# Patient Record
Sex: Male | Born: 1980 | Race: White | Hispanic: No | Marital: Single | Smoking: Current every day smoker
Health system: Southern US, Community
[De-identification: ages and names within clinical notes are randomized; demographics above are authoritative.]

## PROBLEM LIST (undated history)

## (undated) DIAGNOSIS — J45909 Unspecified asthma, uncomplicated: Secondary | ICD-10-CM

## (undated) HISTORY — DX: Unspecified asthma, uncomplicated: J45.909

---

## 2009-08-01 HISTORY — PX: CHOLECYSTECTOMY: SHX55

## 2010-06-25 ENCOUNTER — Emergency Department: Payer: Self-pay | Admitting: Emergency Medicine

## 2010-07-03 ENCOUNTER — Inpatient Hospital Stay: Payer: Self-pay | Admitting: Surgery

## 2010-07-07 LAB — PATHOLOGY REPORT

## 2010-12-27 IMAGING — US ABDOMEN ULTRASOUND
1 series · 17 of 25 positions shown · non-contrast
Comparison: none

REASON FOR EXAM: abd pain, epigastric and ruq
COMMENTS:

PROCEDURE:     US  - US ABDOMEN GENERAL SURVEY  - July 02, 2010 [DATE]
RESULT:     Abdominal ultrasound dated 07/02/2010.

[Series 1: abdomen ultrasound · 17 of 74 slices shown]
[im 1/74]
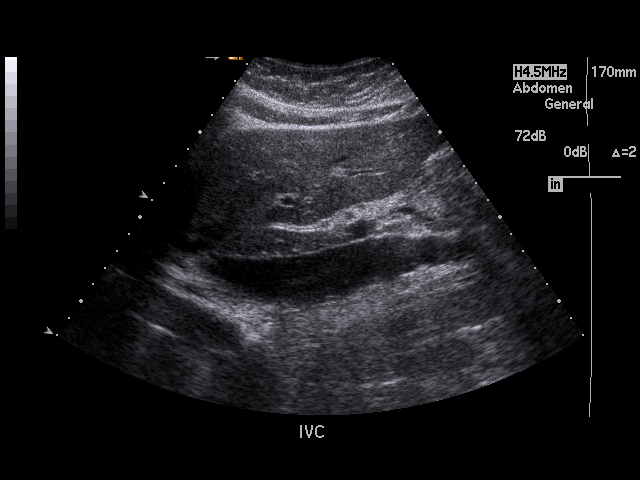
[im 7/74]
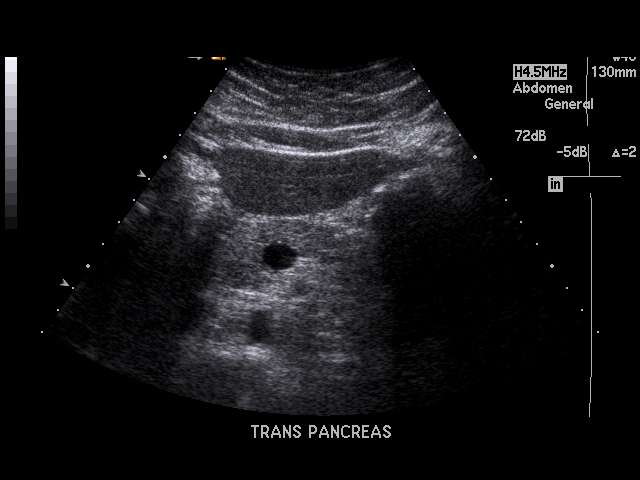
[im 10/74]
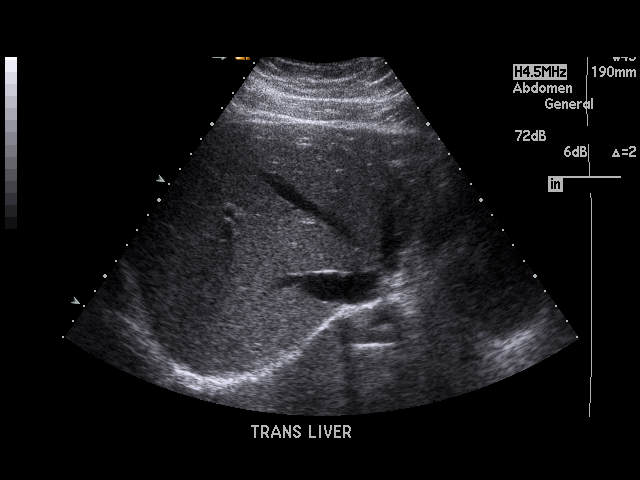
[im 16/74]
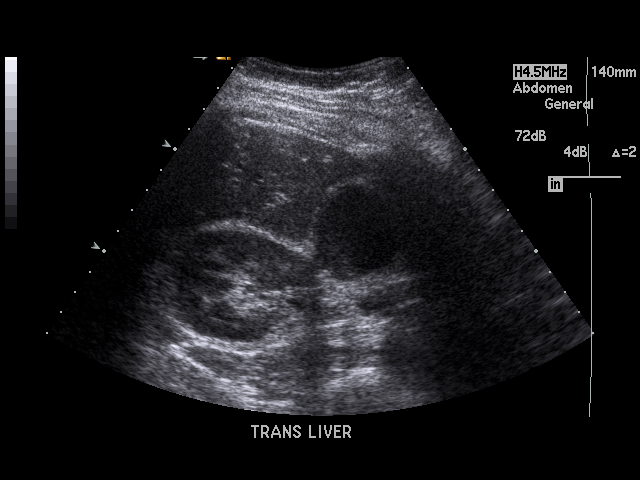
[im 19/74]
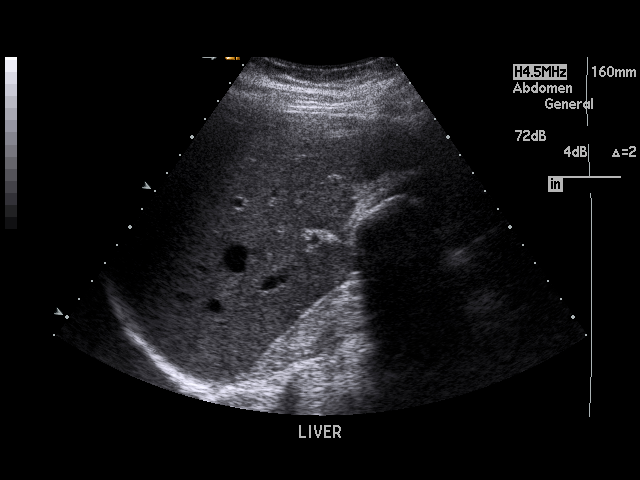
[im 25/74]
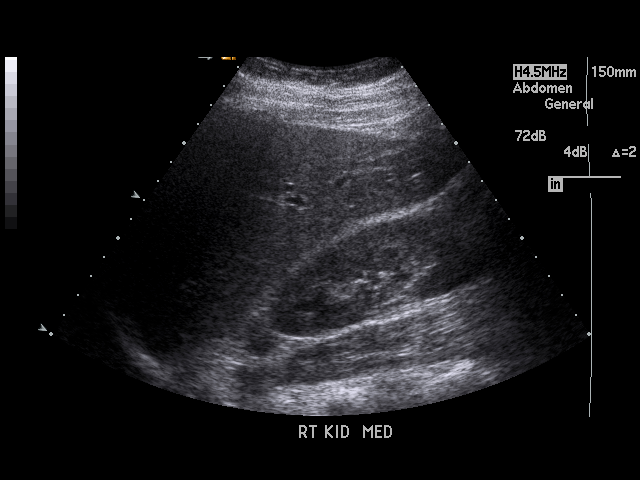
[im 28/74]
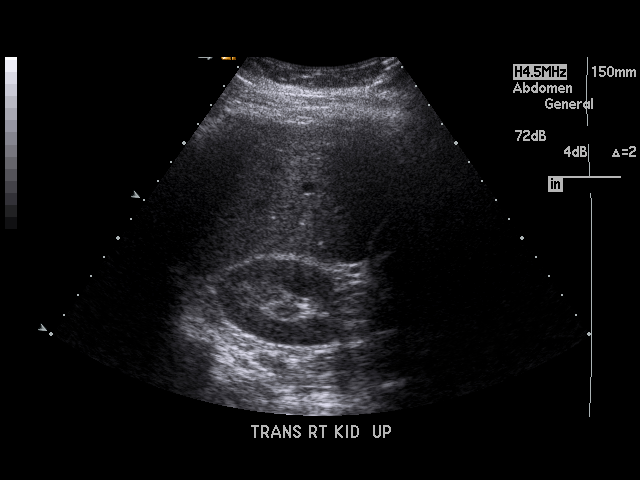
[im 34/74]
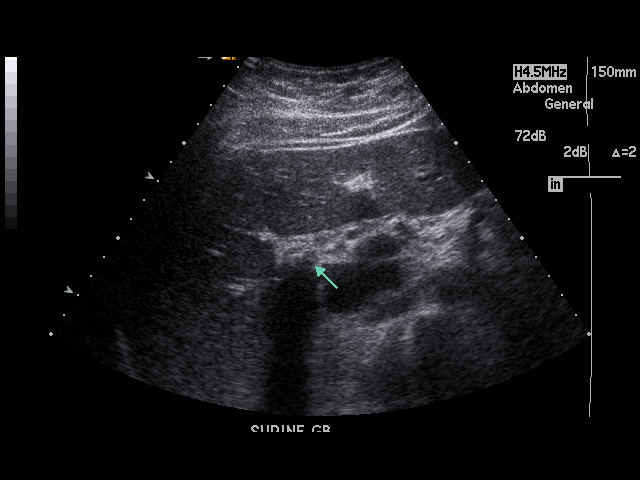
[im 37/74]
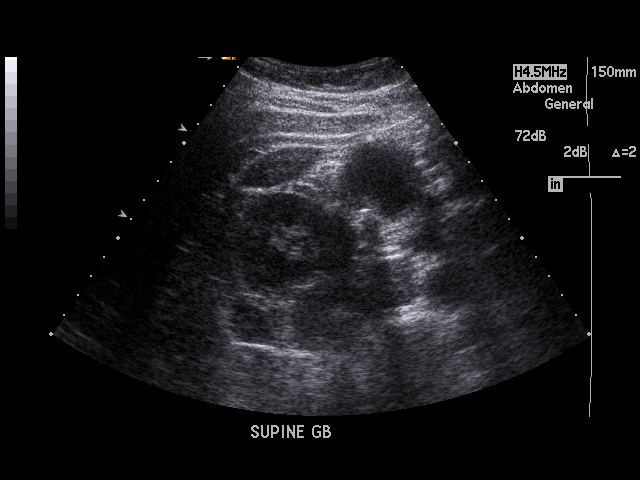
[im 40/74]
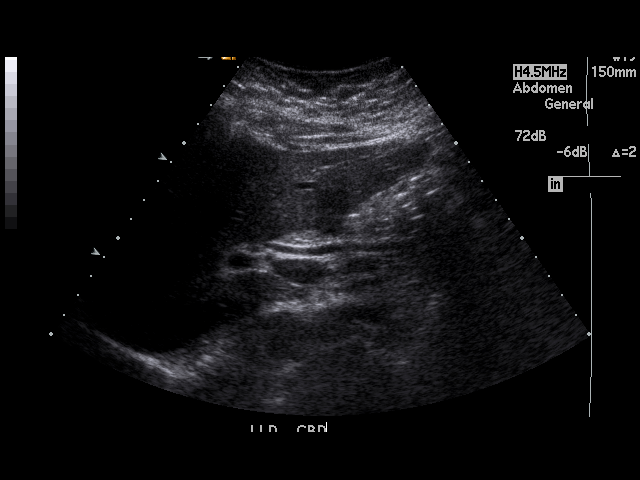
[im 46/74]
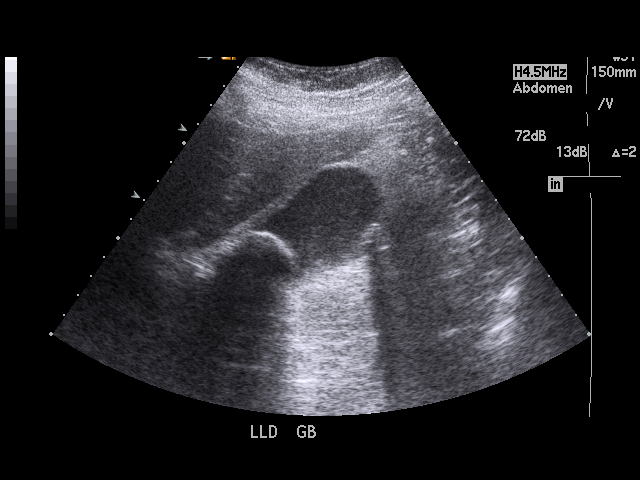
[im 49/74]
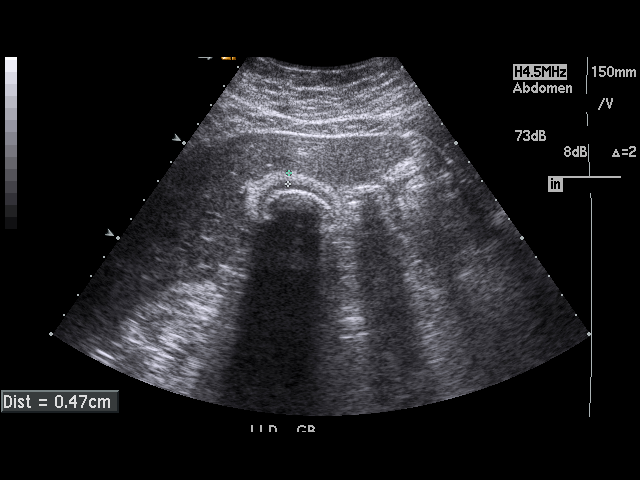
[im 55/74]
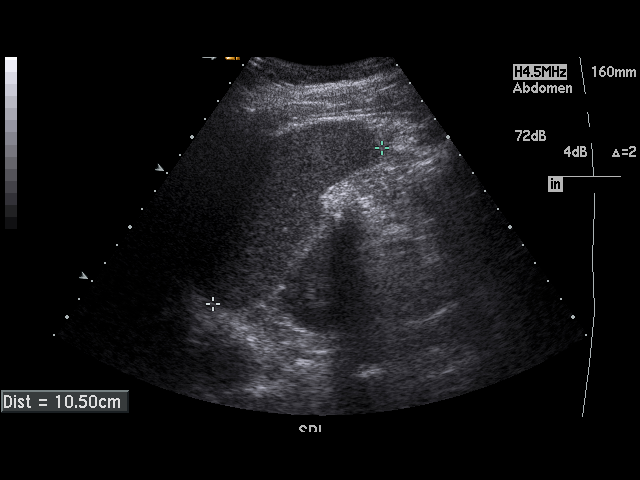
[im 58/74]
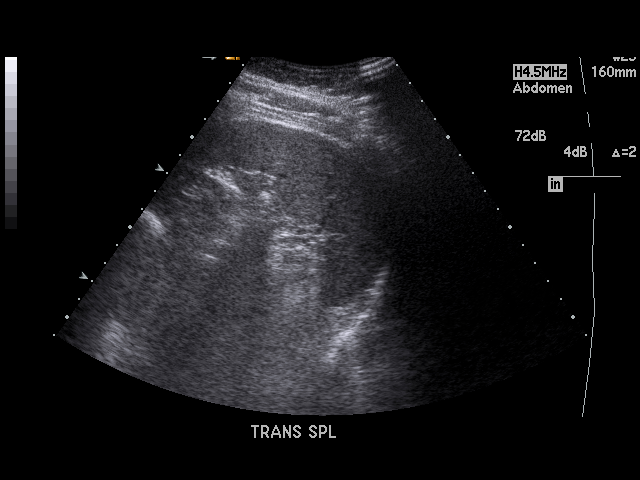
[im 64/74]
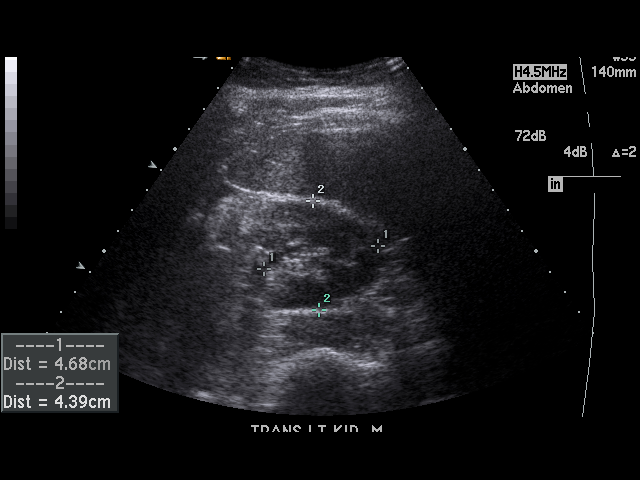
[im 67/74]
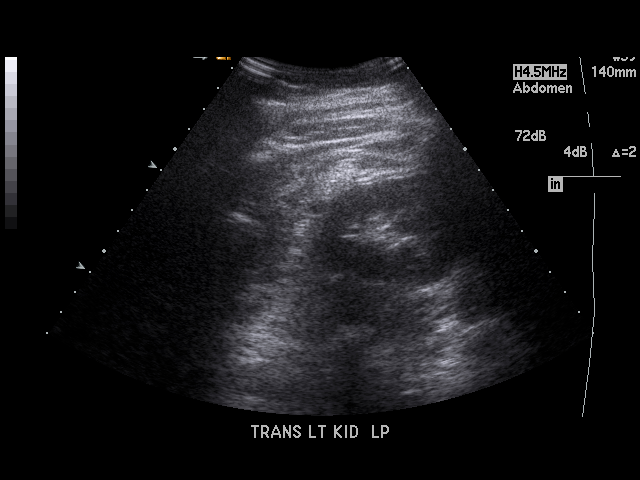
[im 74/74]
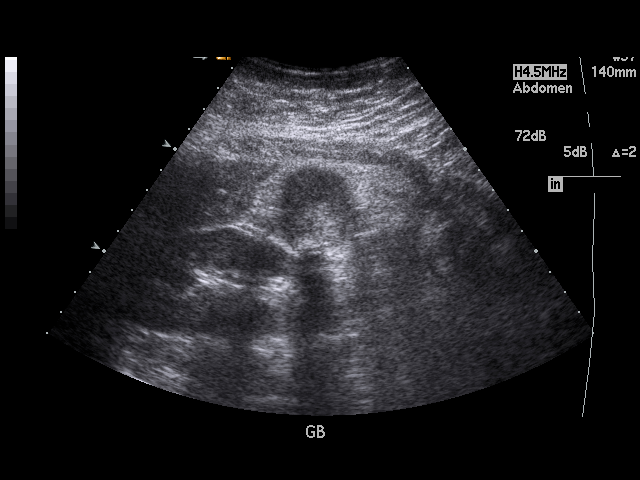

[17 of 25 positions shown; findings below may reference images not displayed]

FINDINGS: The liver demonstrates a homogeneous echotexture. The aorta and
IVC are unremarkable. Visualized portion of pancreas is unremarkable.
Evaluation of the gallbladder demonstrates multiple gallstones the
gallbladder as well as sludge. Patient demonstrates mildly positive
sonographic Murphy's sign. Gallbladder wall is thickened at 4.7 mm. Common
bile that measures 4.7 mm in diameter. There is evidence of pericholecystic
fluid.

The right kidney measures 10.7 x 5.02 x 4.47 cm. The left 11.2 x 4.68 x
cm. There is no evidence of hydronephrosis masses or calculi. The spleen is
homogeneous in echotexture and measures 10.5 cm in longitudinal dimensions.
IMPRESSION: Findings suspicious for cholecystitis. There is evidence of
gallstones an gallbladder wall thickening as well as a mild sonographic
Murphy's sign. Surgical consultation recommended if clinically warranted.
2. No further abnormalities.

## 2013-07-09 ENCOUNTER — Emergency Department: Payer: Self-pay | Admitting: Emergency Medicine

## 2013-07-09 LAB — COMPREHENSIVE METABOLIC PANEL
Albumin: 4 g/dL (ref 3.4–5.0)
Alkaline Phosphatase: 70 U/L
Anion Gap: 3 — ABNORMAL LOW (ref 7–16)
BUN: 10 mg/dL (ref 7–18)
Bilirubin,Total: 0.4 mg/dL (ref 0.2–1.0)
Creatinine: 1 mg/dL (ref 0.60–1.30)
EGFR (Non-African Amer.): >60
Glucose: 123 mg/dL — ABNORMAL HIGH (ref 65–99)
Potassium: 3.7 mmol/L (ref 3.5–5.1)
SGOT(AST): 28 U/L (ref 15–37)
Total Protein: 8.2 g/dL (ref 6.4–8.2)

## 2013-07-09 LAB — CBC
HCT: 48.1 % (ref 40.0–52.0)
MCHC: 34.7 g/dL (ref 32.0–36.0)
RBC: 5.58 10*6/uL (ref 4.40–5.90)
WBC: 8.6 10*3/uL (ref 3.8–10.6)

## 2013-07-09 LAB — RAPID INFLUENZA A&B ANTIGENS

## 2022-08-16 DIAGNOSIS — R600 Localized edema: Secondary | ICD-10-CM | POA: Diagnosis not present

## 2022-08-19 ENCOUNTER — Encounter: Payer: Self-pay | Admitting: Nurse Practitioner

## 2022-08-19 ENCOUNTER — Ambulatory Visit: Payer: BC Managed Care – PPO | Admitting: Nurse Practitioner

## 2022-08-19 VITALS — BP 120/77 | HR 68 | Temp 98.0°F | Ht 69.3 in | Wt 256.1 lb

## 2022-08-19 DIAGNOSIS — Z716 Tobacco abuse counseling: Secondary | ICD-10-CM | POA: Diagnosis not present

## 2022-08-19 DIAGNOSIS — R600 Localized edema: Secondary | ICD-10-CM | POA: Diagnosis not present

## 2022-08-19 DIAGNOSIS — Z7689 Persons encountering health services in other specified circumstances: Secondary | ICD-10-CM | POA: Diagnosis not present

## 2022-08-19 MED ORDER — NICOTINE 14 MG/24HR TD PT24: 14.0000 mg | MEDICATED_PATCH | Freq: Every day | TRANSDERMAL | 0 refills | Status: DC

## 2022-08-19 NOTE — Assessment & Plan Note (Signed)
Chronic. Ongoing. Will give Nicoderm patch to aid in smoking cessation.  Discussed side effects and benefits discussed during visit.  Discussed how to use the patch.  Recommend picking a day to start the patch and quit smoking at the same time.  Follow up in 1 month.  Call sooner if concerns arise.

## 2022-08-19 NOTE — Progress Notes (Signed)
BP 120/77   Pulse 68   Temp 98 F (36.7 C) (Oral)   Ht 5' 9.3" (1.76 m)   Wt 256 lb 1.6 oz (116.2 kg)   SpO2 98%   BMI 37.49 kg/m    Subjective:    Patient ID: Miguel Hull, male    DOB: [birth year], 42 y.o.   MRN: [id number]  HPI: Miguel Hull is a 42 y.o. male  Chief Complaint  Patient presents with   Establish Care   Patient presents to clinic to establish care with new PCP.  Introduced to Designer, jewellery role and practice setting.  All questions answered.  Discussed provider/patient relationship and expectations.  Patient is a current everyday smoker 1/2-3/4 ppd.  Patient had a cholecystectomy in November 2011.     Patient denies a history of: Hypertension, Elevated Cholesterol, Diabetes, Thyroid problems, Depression, Anxiety, Neurological problems, and Abdominal problems.    Patient states he was seen in UC with lower extremity pain and swelling.  He was not able to get his left foot into his shoe.  He was started on hydrochlorothiazide and symptoms have improved.  He donates plasma regularly. His blood pressure is usually 120-130/70-80.   Denies HA, CP, SOB, dizziness, palpitations, visual changes, and lower extremity swelling.  SMOKING CESSATION Smoking Status: current everyday smoker Smoking Amount: 1/2ppd- 3/4ppd Smoking Onset:  Smoking Quit Date: next week Smoking triggers: Type of tobacco use: cigarettes Treatments attempted: cold [place]- gets bad migraines    Active Ambulatory Problems    Diagnosis Date Noted   Tobacco abuse counseling 08/19/2022   Resolved Ambulatory Problems    Diagnosis Date Noted   No Resolved Ambulatory Problems   Past Medical History:  Diagnosis Date   Asthma    Past Surgical History:  Procedure Laterality Date   CHOLECYSTECTOMY  2011   Family History  Problem Relation Age of Onset   Congestive Heart Failure Mother    Cancer Father    Depression Sister    COPD Sister    Heart murmur Sister    Melanoma Brother       Review of Systems  Eyes:  Negative for visual disturbance.  Respiratory:  Negative for shortness of breath.   Cardiovascular:  Negative for chest pain and leg swelling.  Neurological:  Negative for light-headedness and headaches.    Per HPI unless specifically indicated above     Objective:    BP 120/77   Pulse 68   Temp 98 F (36.7 C) (Oral)   Ht 5' 9.3" (1.76 m)   Wt 256 lb 1.6 oz (116.2 kg)   SpO2 98%   BMI 37.49 kg/m   Wt Readings from Last 3 Encounters:  08/19/22 256 lb 1.6 oz (116.2 kg)    Physical Exam Vitals and nursing note reviewed.  Constitutional:      General: He is not in acute distress.    Appearance: Normal appearance. He is not ill-appearing, toxic-appearing or diaphoretic.  HENT:     Head: Normocephalic.     Right Ear: External ear normal.     Left Ear: External ear normal.     Nose: Nose normal. No congestion or rhinorrhea.     Mouth/Throat:     Mouth: Mucous membranes are moist.  Eyes:     General:        Right eye: No discharge.        Left eye: No discharge.     Extraocular Movements: Extraocular movements intact.     Conjunctiva/sclera:  Conjunctivae normal.     Pupils: Pupils are equal, round, and reactive to light.  Cardiovascular:     Rate and Rhythm: Normal rate and regular rhythm.     Heart sounds: No murmur heard. Pulmonary:     Effort: Pulmonary effort is normal. No respiratory distress.     Breath sounds: Normal breath sounds. No wheezing, rhonchi or rales.  Abdominal:     General: Abdomen is flat. Bowel sounds are normal.  Musculoskeletal:     Cervical back: Normal range of motion and neck supple.     Right lower leg: No edema.     Left lower leg: No edema.  Skin:    General: Skin is warm and dry.     Capillary Refill: Capillary refill takes less than 2 seconds.  Neurological:     General: No focal deficit present.     Mental Status: He is alert and oriented to person, place, and time.  Psychiatric:        Mood and  Affect: Mood normal.        Behavior: Behavior normal.        Thought Content: Thought content normal.        Judgment: Judgment normal.     No results found for this or any previous visit.    Assessment & Plan:   Problem List Items Addressed This Visit       Other   Tobacco abuse counseling - Primary    Chronic. Ongoing. Will give Nicoderm patch to aid in smoking cessation.  Discussed side effects and benefits discussed during visit.  Discussed how to use the patch.  Recommend picking a day to start the patch and quit smoking at the same time.  Follow up in 1 month.  Call sooner if concerns arise.       Other Visit Diagnoses     Lower extremity edema       LLE extremity- resolved with HCTZ. Blood pressure well controlled today. Complete 14 day course from UC follow up in 1 month. If symptom return will restart med   Encounter to establish care            Follow up plan: Return in about 1 month (around 09/19/2022) for Physical and Fasting labs and recheck blood pressure.

## 2022-09-19 ENCOUNTER — Ambulatory Visit: Payer: BC Managed Care – PPO | Admitting: Nurse Practitioner

## 2022-09-19 ENCOUNTER — Encounter: Payer: Self-pay | Admitting: Nurse Practitioner

## 2022-09-19 VITALS — BP 130/83 | HR 87 | Temp 98.0°F | Ht 69.2 in | Wt 264.3 lb

## 2022-09-19 DIAGNOSIS — Z114 Encounter for screening for human immunodeficiency virus [HIV]: Secondary | ICD-10-CM

## 2022-09-19 DIAGNOSIS — Z1159 Encounter for screening for other viral diseases: Secondary | ICD-10-CM

## 2022-09-19 DIAGNOSIS — Z23 Encounter for immunization: Secondary | ICD-10-CM | POA: Diagnosis not present

## 2022-09-19 DIAGNOSIS — Z716 Tobacco abuse counseling: Secondary | ICD-10-CM | POA: Diagnosis not present

## 2022-09-19 DIAGNOSIS — R600 Localized edema: Secondary | ICD-10-CM | POA: Diagnosis not present

## 2022-09-19 DIAGNOSIS — Z136 Encounter for screening for cardiovascular disorders: Secondary | ICD-10-CM

## 2022-09-19 DIAGNOSIS — Z0000 Encounter for general adult medical examination without abnormal findings: Secondary | ICD-10-CM

## 2022-09-19 DIAGNOSIS — E669 Obesity, unspecified: Secondary | ICD-10-CM

## 2022-09-19 MED ORDER — NICOTINE 14 MG/24HR TD PT24: 14.0000 mg | MEDICATED_PATCH | Freq: Every day | TRANSDERMAL | 2 refills | Status: DC

## 2022-09-19 NOTE — Progress Notes (Signed)
BP 130/83   Pulse 87   Temp 98 F (36.7 C) (Oral)   Ht 5' 9.2" (1.758 m)   Wt 264 lb 4.8 oz (119.9 kg)   SpO2 98%   BMI 38.81 kg/m    Subjective:    Patient ID: Miguel Hull, male    DOB: 1981-02-18, 42 y.o.   MRN: [id number]  HPI: Miguel Hull is a 42 y.o. male presenting on 09/19/2022 for comprehensive medical examination. Current medical complaints include:none  He currently lives with: Interim Problems from his last visit: no  ELEVATED BLOOD PRESSURE Duration of elevated BP: unknown BP monitoring frequency: not checking BP range:  Previous BP meds: no Recent stressors: no Family history of hypertension: yes Recurrent headaches: no Visual changes: no Palpitations: no  Dyspnea: no Chest pain: no Lower extremity edema: no Dizzy/lightheaded: no Transient ischemic attacks: no  SMOKING CESSATION Patient states he was smoking more than a ppd.  Now he is smoking 1/2 ppd.  Patient states he is more relaxed when he is at home and doesn't smoke as much.  He is more stressed at work and finds that he smokes there.    Depression Screen done today and results listed below:     09/19/2022   10:13 AM 08/19/2022   10:53 AM  Depression screen PHQ 2/9  Decreased Interest 1 0  Down, Depressed, Hopeless 1 0  PHQ - 2 Score 2 0  Altered sleeping 1 1  Tired, decreased energy 1 1  Change in appetite 1 1  Feeling bad or failure about yourself  0 0  Trouble concentrating 1 0  Moving slowly or fidgety/restless 0 0  Suicidal thoughts 0 0  PHQ-9 Score 6 3  Difficult doing work/chores Somewhat difficult Not difficult at all    The patient does not have a history of falls. I did complete a risk assessment for falls. A plan of care for falls was documented.   Past Medical History:  Past Medical History:  Diagnosis Date   Asthma     Surgical History:  Past Surgical History:  Procedure Laterality Date   CHOLECYSTECTOMY  2011    Medications:  Current  Outpatient Medications on File Prior to Visit  Medication Sig   hydrochlorothiazide (HYDRODIURIL) 25 MG tablet Take 25 mg by mouth daily. (Patient not taking: Reported on 09/19/2022)   No current facility-administered medications on file prior to visit.    Allergies:  No Known Allergies  Social History:  Social History   Socioeconomic History   Marital status: Single    Spouse name: Not on file   Number of children: Not on file   Years of education: Not on file   Highest education level: Not on file  Occupational History   Not on file  Tobacco Use   Smoking status: Every Day    Packs/day: 0.50    Types: Cigarettes   Smokeless tobacco: Never  Vaping Use   Vaping Use: Never used  Substance and Sexual Activity   Alcohol use: Not Currently   Drug use: Never   Sexual activity: Not Currently  Other Topics Concern   Not on file  Social History Narrative   Not on file   Social Determinants of Health   Financial Resource Strain: Not on file  Food Insecurity: Not on file  Transportation Needs: Not on file  Physical Activity: Not on file  Stress: Not on file  Social Connections: Not on file  Intimate Partner Violence: Not on  file   Social History   Tobacco Use  Smoking Status Every Day   Packs/day: 0.50   Types: Cigarettes  Smokeless Tobacco Never   Social History   Substance and Sexual Activity  Alcohol Use Not Currently    Family History:  Family History  Problem Relation Age of Onset   Congestive Heart Failure Mother    Cancer Father    Depression Sister    COPD Sister    Heart murmur Sister    Melanoma Brother     Past medical history, surgical history, medications, allergies, family history and social history reviewed with patient today and changes made to appropriate areas of the chart.   Review of Systems  Eyes:  Negative for blurred vision and double vision.  Respiratory:  Negative for shortness of breath.   Cardiovascular:  Negative for chest  pain, palpitations and leg swelling.  Neurological:  Negative for dizziness and headaches.   All other ROS negative except what is listed above and in the HPI.      Objective:    BP 130/83   Pulse 87   Temp 98 F (36.7 C) (Oral)   Ht 5' 9.2" (1.758 m)   Wt 264 lb 4.8 oz (119.9 kg)   SpO2 98%   BMI 38.81 kg/m   Wt Readings from Last 3 Encounters:  09/19/22 264 lb 4.8 oz (119.9 kg)  08/19/22 256 lb 1.6 oz (116.2 kg)    Physical Exam Vitals and nursing note reviewed.  Constitutional:      General: He is not in acute distress.    Appearance: Normal appearance. He is obese. He is not ill-appearing, toxic-appearing or diaphoretic.  HENT:     Head: Normocephalic.     Right Ear: Tympanic membrane, ear canal and external ear normal.     Left Ear: Tympanic membrane, ear canal and external ear normal.     Nose: Nose normal. No congestion or rhinorrhea.     Mouth/Throat:     Mouth: Mucous membranes are moist.  Eyes:     General:        Right eye: No discharge.        Left eye: No discharge.     Extraocular Movements: Extraocular movements intact.     Conjunctiva/sclera: Conjunctivae normal.     Pupils: Pupils are equal, round, and reactive to light.  Cardiovascular:     Rate and Rhythm: Normal rate and regular rhythm.     Heart sounds: No murmur heard. Pulmonary:     Effort: Pulmonary effort is normal. No respiratory distress.     Breath sounds: Normal breath sounds. No wheezing, rhonchi or rales.  Abdominal:     General: Abdomen is flat. Bowel sounds are normal. There is no distension.     Palpations: Abdomen is soft.     Tenderness: There is no abdominal tenderness. There is no guarding.  Musculoskeletal:     Cervical back: Normal range of motion and neck supple.  Skin:    General: Skin is warm and dry.     Capillary Refill: Capillary refill takes less than 2 seconds.  Neurological:     General: No focal deficit present.     Mental Status: He is alert and oriented to  person, place, and time.     Cranial Nerves: No cranial nerve deficit.     Motor: No weakness.     Deep Tendon Reflexes: Reflexes normal.  Psychiatric:        Mood and  Affect: Mood normal.        Behavior: Behavior normal.        Thought Content: Thought content normal.        Judgment: Judgment normal.     No results found for this or any previous visit.    Assessment & Plan:   Problem List Items Addressed This Visit       Other   Tobacco abuse counseling    Chronic. Has cut down to 1/2ppd.  Feels like the patch is working for him.  Continue with 75m patch.  Recommend decreasing by 1 cigarette per day.  Follow up in 3 months.  Call sooner if concerns arise.      Obesity (BMI 30-39.9)    Recommended eating smaller high protein, low fat meals more frequently and exercising 30 mins a day 5 times a week with a goal of 10-15lb weight loss in the next 3 months.       Other Visit Diagnoses     Annual physical exam    -  Primary   Health maintenance reviewed durig visit today.  Labs ordered.  Reviewed vaccines.   Relevant Orders   TSH   CBC with Differential/Platelet   Comprehensive metabolic panel   Urinalysis, Routine w reflex microscopic   HIV Antibody (routine testing w rflx)   Hepatitis C Antibody   Lower extremity edema       Lower extremity swelling has resolved.  Will continue with out HCTZ.   Screening for ischemic heart disease       Relevant Orders   Lipid panel   Screening for HIV (human immunodeficiency virus)       Relevant Orders   HIV Antibody (routine testing w rflx)   Encounter for hepatitis C screening test for low risk patient       Relevant Orders   Hepatitis C Antibody   Need for Tdap vaccination       Relevant Orders   Tdap vaccine greater than or equal to 7yo IM (Completed)        Discussed aspirin prophylaxis for myocardial infarction prevention and decision was it was not indicated  LABORATORY TESTING:  Health maintenance labs ordered  today as discussed above.     IMMUNIZATIONS:   - Tdap: Tetanus vaccination status reviewed: last tetanus booster within 10 years. - Influenza: Refused - Pneumovax: Not applicable - Prevnar: Not applicable - COVID: Refused - HPV: Not applicable - Shingrix vaccine: Not applicable  SCREENING: - Colonoscopy: Not applicable  Discussed with patient purpose of the colonoscopy is to detect colon cancer at curable precancerous or early stages   - AAA Screening: Not applicable  -Hearing Test: Not applicable  -Spirometry: Not applicable   PATIENT COUNSELING:    Sexuality: Discussed sexually transmitted diseases, partner selection, use of condoms, avoidance of unintended pregnancy  and contraceptive alternatives.   Advised to avoid cigarette smoking.  I discussed with the patient that most people either abstain from alcohol or drink within safe limits (<=14/week and <=4 drinks/occasion for males, <=7/weeks and <= 3 drinks/occasion for females) and that the risk for alcohol disorders and other health effects rises proportionally with the number of drinks per week and how often a drinker exceeds daily limits.  Discussed cessation/primary prevention of drug use and availability of treatment for abuse.   Diet: Encouraged to adjust caloric intake to maintain  or achieve ideal body weight, to reduce intake of dietary saturated fat and total fat, to limit sodium intake  by avoiding high sodium foods and not adding table salt, and to maintain adequate dietary potassium and calcium preferably from fresh fruits, vegetables, and low-fat dairy products.    stressed the importance of regular exercise  Injury prevention: Discussed safety belts, safety helmets, smoke detector, smoking near bedding or upholstery.   Dental health: Discussed importance of regular tooth brushing, flossing, and dental visits.   Follow up plan: NEXT PREVENTATIVE PHYSICAL DUE IN 1 YEAR. Return in about 3 months (around  12/18/2022) for Leg swelling and smoking cessation.

## 2022-09-19 NOTE — Assessment & Plan Note (Signed)
Recommended eating smaller high protein, low fat meals more frequently and exercising 30 mins a day 5 times a week with a goal of 10-15lb weight loss in the next 3 months.  

## 2022-09-19 NOTE — Assessment & Plan Note (Signed)
Chronic. Has cut down to 1/2ppd.  Feels like the patch is working for him.  Continue with 30m patch.  Recommend decreasing by 1 cigarette per day.  Follow up in 3 months.  Call sooner if concerns arise.

## 2022-09-20 LAB — LIPID PANEL
Chol/HDL Ratio: 4.7 ratio (ref 0.0–5.0)
Cholesterol, Total: 159 mg/dL (ref 100–199)
HDL: 34 mg/dL — ABNORMAL LOW (ref >39)
LDL Chol Calc (NIH): 88 mg/dL (ref 0–99)
Triglycerides: 221 mg/dL — ABNORMAL HIGH (ref 0–149)
VLDL Cholesterol Cal: 37 mg/dL (ref 5–40)

## 2022-09-20 LAB — CBC WITH DIFFERENTIAL/PLATELET
Basophils Absolute: 0 10*3/uL (ref 0.0–0.2)
Basos: 0 %
EOS (ABSOLUTE): 0.2 10*3/uL (ref 0.0–0.4)
Eos: 2 %
Hematocrit: 46.3 % (ref 37.5–51.0)
Hemoglobin: 15.7 g/dL (ref 13.0–17.7)
Immature Grans (Abs): 0 10*3/uL (ref 0.0–0.1)
Immature Granulocytes: 0 %
Lymphocytes Absolute: 2.3 10*3/uL (ref 0.7–3.1)
Lymphs: 26 %
MCH: 29.2 pg (ref 26.6–33.0)
MCHC: 33.9 g/dL (ref 31.5–35.7)
MCV: 86 fL (ref 79–97)
Monocytes Absolute: 0.6 10*3/uL (ref 0.1–0.9)
Monocytes: 7 %
Neutrophils Absolute: 5.8 10*3/uL (ref 1.4–7.0)
Neutrophils: 65 %
Platelets: 243 10*3/uL (ref 150–450)
RBC: 5.37 x10E6/uL (ref 4.14–5.80)
RDW: 13.1 % (ref 11.6–15.4)
WBC: 8.9 10*3/uL (ref 3.4–10.8)

## 2022-09-20 LAB — URINALYSIS, ROUTINE W REFLEX MICROSCOPIC
Bilirubin, UA: NEGATIVE
Glucose, UA: NEGATIVE
Ketones, UA: NEGATIVE
Leukocytes,UA: NEGATIVE
Nitrite, UA: NEGATIVE
Protein,UA: NEGATIVE
RBC, UA: NEGATIVE
Specific Gravity, UA: 1.023 (ref 1.005–1.030)
Urobilinogen, Ur: 0.2 mg/dL (ref 0.2–1.0)
pH, UA: 5 (ref 5.0–7.5)

## 2022-09-20 LAB — COMPREHENSIVE METABOLIC PANEL
ALT: 64 IU/L — ABNORMAL HIGH (ref 0–44)
AST: 30 IU/L (ref 0–40)
Albumin/Globulin Ratio: 1.8 (ref 1.2–2.2)
Albumin: 4.5 g/dL (ref 4.1–5.1)
Alkaline Phosphatase: 68 IU/L (ref 44–121)
BUN/Creatinine Ratio: 10 (ref 9–20)
BUN: 7 mg/dL (ref 6–24)
Bilirubin Total: 0.3 mg/dL (ref 0.0–1.2)
CO2: 23 mmol/L (ref 20–29)
Calcium: 9.2 mg/dL (ref 8.7–10.2)
Chloride: 104 mmol/L (ref 96–106)
Creatinine, Ser: 0.73 mg/dL — ABNORMAL LOW (ref 0.76–1.27)
Globulin, Total: 2.5 g/dL (ref 1.5–4.5)
Glucose: 105 mg/dL — ABNORMAL HIGH (ref 70–99)
Potassium: 4.5 mmol/L (ref 3.5–5.2)
Sodium: 141 mmol/L (ref 134–144)
Total Protein: 7 g/dL (ref 6.0–8.5)
eGFR: 117 mL/min/{1.73_m2} (ref >59)

## 2022-09-20 LAB — HEPATITIS C ANTIBODY: Hep C Virus Ab: NONREACTIVE

## 2022-09-20 LAB — TSH: TSH: 4.81 u[IU]/mL — ABNORMAL HIGH (ref 0.450–4.500)

## 2022-09-20 LAB — SPECIMEN STATUS REPORT

## 2022-09-20 LAB — HIV ANTIBODY (ROUTINE TESTING W REFLEX): HIV Screen 4th Generation wRfx: NONREACTIVE

## 2022-09-20 NOTE — Progress Notes (Signed)
Please let patient know that his thyroid lab was slightly abnormal.  We will recheck this at his next visit.  His triglycerides are elevated.  I recommend decreasing processed foods and refined sugar intake. Otherwise, his lab work looks good.  No other concerns at this time.

## 2022-12-20 ENCOUNTER — Ambulatory Visit: Payer: BC Managed Care – PPO | Admitting: Nurse Practitioner

## 2022-12-20 ENCOUNTER — Encounter: Payer: Self-pay | Admitting: Nurse Practitioner

## 2022-12-20 VITALS — BP 142/81 | HR 76 | Temp 98.7°F | Wt 261.4 lb

## 2022-12-20 DIAGNOSIS — I10 Essential (primary) hypertension: Secondary | ICD-10-CM

## 2022-12-20 DIAGNOSIS — R7309 Other abnormal glucose: Secondary | ICD-10-CM

## 2022-12-20 MED ORDER — LISINOPRIL 20 MG PO TABS: 20.0000 mg | ORAL_TABLET | Freq: Every day | ORAL | 0 refills | Status: DC

## 2022-12-20 NOTE — Progress Notes (Signed)
BP (!) 142/81   Pulse 76   Temp 98.7 F (37.1 C) (Oral)   Wt 261 lb 6.4 oz (118.6 kg)   SpO2 98%   BMI 38.38 kg/m    Subjective:    Patient ID: Miguel Hull, male    DOB: 06-Mar-1981, 42 y.o.   MRN: [id number]  HPI: Miguel Hull is a 42 y.o. male  Chief Complaint  Patient presents with   Leg Swelling   Nicotine Dependence   ELEVATED BLOOD PRESSURE Duration of elevated BP: unknown BP monitoring frequency: not checking BP range:  Previous BP meds: no Recent stressors: no Family history of hypertension: yes Recurrent headaches: no Visual changes: no Palpitations: no  Dyspnea: no Chest pain: no Lower extremity edema: yes Dizzy/lightheaded: no Transient ischemic attacks: no Patient states the swelling in his lower extremities does not resolve over night.  He has been wearing compression socks.    SMOKING CESSATION Patient states he was smoking more than a ppd.  Now he is smoking 1/2 ppd.  Patient states he broke out with the patch.  Patient states he is more relaxed when he is at home and doesn't smoke as much.  He is more stressed at work and finds that he smokes there.     Relevant past medical, surgical, family and social history reviewed and updated as indicated. Interim medical history since our last visit reviewed. Allergies and medications reviewed and updated.  Review of Systems  Eyes:  Negative for visual disturbance.  Respiratory:  Negative for shortness of breath.   Cardiovascular:  Positive for leg swelling. Negative for chest pain.  Neurological:  Negative for light-headedness and headaches.    Per HPI unless specifically indicated above     Objective:    BP (!) 142/81   Pulse 76   Temp 98.7 F (37.1 C) (Oral)   Wt 261 lb 6.4 oz (118.6 kg)   SpO2 98%   BMI 38.38 kg/m   Wt Readings from Last 3 Encounters:  12/20/22 261 lb 6.4 oz (118.6 kg)  09/19/22 264 lb 4.8 oz (119.9 kg)  08/19/22 256 lb 1.6 oz (116.2 kg)    Physical  Exam Vitals and nursing note reviewed.  Constitutional:      General: He is not in acute distress.    Appearance: Normal appearance. He is obese. He is not ill-appearing, toxic-appearing or diaphoretic.  HENT:     Head: Normocephalic.     Right Ear: External ear normal.     Left Ear: External ear normal.     Nose: Nose normal. No congestion or rhinorrhea.     Mouth/Throat:     Mouth: Mucous membranes are moist.  Eyes:     General:        Right eye: No discharge.        Left eye: No discharge.     Extraocular Movements: Extraocular movements intact.     Conjunctiva/sclera: Conjunctivae normal.     Pupils: Pupils are equal, round, and reactive to light.  Cardiovascular:     Rate and Rhythm: Normal rate and regular rhythm.     Heart sounds: No murmur heard. Pulmonary:     Effort: Pulmonary effort is normal. No respiratory distress.     Breath sounds: Normal breath sounds. No wheezing, rhonchi or rales.  Abdominal:     General: Abdomen is flat. Bowel sounds are normal.  Musculoskeletal:     Cervical back: Normal range of motion and neck supple.  Skin:  General: Skin is warm and dry.     Capillary Refill: Capillary refill takes less than 2 seconds.  Neurological:     General: No focal deficit present.     Mental Status: He is alert and oriented to person, place, and time.  Psychiatric:        Mood and Affect: Mood normal.        Behavior: Behavior normal.        Thought Content: Thought content normal.        Judgment: Judgment normal.     Results for orders placed or performed in visit on 09/19/22  TSH  Result Value Ref Range   TSH 4.810 (H) 0.450 - 4.500 uIU/mL  Lipid panel  Result Value Ref Range   Cholesterol, Total 159 100 - 199 mg/dL   Triglycerides 161 (H) 0 - 149 mg/dL   HDL 34 (L) >09 mg/dL   VLDL Cholesterol Cal 37 5 - 40 mg/dL   LDL Chol Calc (NIH) 88 0 - 99 mg/dL   Chol/HDL Ratio 4.7 0.0 - 5.0 ratio  CBC with Differential/Platelet  Result Value Ref  Range   WBC 8.9 3.4 - 10.8 x10E3/uL   RBC 5.37 4.14 - 5.80 x10E6/uL   Hemoglobin 15.7 13.0 - 17.7 g/dL   Hematocrit 60.4 54.0 - 51.0 %   MCV 86 79 - 97 fL   MCH 29.2 26.6 - 33.0 pg   MCHC 33.9 31.5 - 35.7 g/dL   RDW 98.1 19.1 - 47.8 %   Platelets 243 150 - 450 x10E3/uL   Neutrophils 65 Not Estab. %   Lymphs 26 Not Estab. %   Monocytes 7 Not Estab. %   Eos 2 Not Estab. %   Basos 0 Not Estab. %   Neutrophils Absolute 5.8 1.4 - 7.0 x10E3/uL   Lymphocytes Absolute 2.3 0.7 - 3.1 x10E3/uL   Monocytes Absolute 0.6 0.1 - 0.9 x10E3/uL   EOS (ABSOLUTE) 0.2 0.0 - 0.4 x10E3/uL   Basophils Absolute 0.0 0.0 - 0.2 x10E3/uL   Immature Granulocytes 0 Not Estab. %   Immature Grans (Abs) 0.0 0.0 - 0.1 x10E3/uL  Comprehensive metabolic panel  Result Value Ref Range   Glucose 105 (H) 70 - 99 mg/dL   BUN 7 6 - 24 mg/dL   Creatinine, Ser 2.95 (L) 0.76 - 1.27 mg/dL   eGFR 621 >30 QM/VHQ/4.69   BUN/Creatinine Ratio 10 9 - 20   Sodium 141 134 - 144 mmol/L   Potassium 4.5 3.5 - 5.2 mmol/L   Chloride 104 96 - 106 mmol/L   CO2 23 20 - 29 mmol/L   Calcium 9.2 8.7 - 10.2 mg/dL   Total Protein 7.0 6.0 - 8.5 g/dL   Albumin 4.5 4.1 - 5.1 g/dL   Globulin, Total 2.5 1.5 - 4.5 g/dL   Albumin/Globulin Ratio 1.8 1.2 - 2.2   Bilirubin Total 0.3 0.0 - 1.2 mg/dL   Alkaline Phosphatase 68 44 - 121 IU/L   AST 30 0 - 40 IU/L   ALT 64 (H) 0 - 44 IU/L  Urinalysis, Routine w reflex microscopic  Result Value Ref Range   Specific Gravity, UA 1.023 1.005 - 1.030   pH, UA 5.0 5.0 - 7.5   Color, UA Yellow Yellow   Appearance Ur Clear Clear   Leukocytes,UA Negative Negative   Protein,UA Negative Negative/Trace   Glucose, UA Negative Negative   Ketones, UA Negative Negative   RBC, UA Negative Negative   Bilirubin, UA Negative Negative  Urobilinogen, Ur 0.2 0.2 - 1.0 mg/dL   Nitrite, UA Negative Negative   Microscopic Examination Comment   HIV Antibody (routine testing w rflx)  Result Value Ref Range   HIV  Screen 4th Generation wRfx Non Reactive Non Reactive  Hepatitis C Antibody  Result Value Ref Range   Hep C Virus Ab Non Reactive Non Reactive  Specimen status report  Result Value Ref Range   specimen status report Comment       Assessment & Plan:   Problem List Items Addressed This Visit       Cardiovascular and Mediastinum   Hypertension - Primary    Chronic. Not well controlled.  Will start Lisinopril 20mg  daily.  Side effects and benefits discussed during visit.  Will check labs at next visit.  Follow up in 1 month. Call sooner if concerns arise.       Relevant Medications   lisinopril (ZESTRIL) 20 MG tablet   Other Visit Diagnoses     Elevated glucose       Slightly elevated at physical.  Will check A1c at next visit with lab work.        Follow up plan: Return in about 1 month (around 01/20/2023) for BP Check.

## 2022-12-20 NOTE — Assessment & Plan Note (Signed)
Chronic. Not well controlled.  Will start Lisinopril 20mg  daily.  Side effects and benefits discussed during visit.  Will check labs at next visit.  Follow up in 1 month. Call sooner if concerns arise.

## 2023-01-20 ENCOUNTER — Ambulatory Visit: Payer: BC Managed Care – PPO | Admitting: Family Medicine

## 2023-01-20 ENCOUNTER — Encounter: Payer: Self-pay | Admitting: Family Medicine

## 2023-01-20 VITALS — BP 128/82 | HR 74 | Temp 97.6°F | Wt 260.6 lb

## 2023-01-20 DIAGNOSIS — I10 Essential (primary) hypertension: Secondary | ICD-10-CM

## 2023-01-20 NOTE — Progress Notes (Signed)
BP 128/82   Pulse 74   Temp 97.6 F (36.4 C) (Oral)   Wt 260 lb 9.6 oz (118.2 kg)   SpO2 93%   BMI 38.26 kg/m    Subjective:    Patient ID: Miguel Hull, male    DOB: 09/11/1980, 42 y.o.   MRN: [id number]  HPI: Miguel Hull is a 42 y.o. male  Chief Complaint  Patient presents with   BP follow up   ELEVATED BLOOD PRESSURE He has been taking Lisinopril 20 mg daily for 4 weeks now. He is taking it nightly. He is eating more baked foods instead of fried. His water intake has increased to x5 water bottles daily.He is physically active at work, does not do much exercise when not at work. Patient states the swelling in his lower extremities have resolved. He has been wearing compression socks.   Duration of elevated BP: unknown BP monitoring frequency: not checking BP range: not checking at home, will start checking and bring in readings at next visit. Previous BP meds: no Recent stressors: no Family history of hypertension: yes Recurrent headaches: no Visual changes: no Palpitations: no  Dyspnea: no Chest pain: no Lower extremity edema: yes Dizzy/lightheaded: no Transient ischemic attacks: no  Relevant past medical, surgical, family and social history reviewed and updated as indicated. Interim medical history since our last visit reviewed. Allergies and medications reviewed and updated.  Review of Systems  Eyes:  Negative for visual disturbance.  Respiratory:  Negative for shortness of breath.   Cardiovascular:  Negative for chest pain, palpitations and leg swelling.  Neurological:  Negative for light-headedness and headaches.   Per HPI unless specifically indicated above     Objective:    BP 128/82   Pulse 74   Temp 97.6 F (36.4 C) (Oral)   Wt 260 lb 9.6 oz (118.2 kg)   SpO2 93%   BMI 38.26 kg/m   Wt Readings from Last 3 Encounters:  01/20/23 260 lb 9.6 oz (118.2 kg)  12/20/22 261 lb 6.4 oz (118.6 kg)  09/19/22 264 lb 4.8 oz (119.9 kg)     Physical Exam Vitals and nursing note reviewed.  Constitutional:      General: He is not in acute distress.    Appearance: Normal appearance. He is obese. He is not ill-appearing, toxic-appearing or diaphoretic.  HENT:     Head: Normocephalic.     Right Ear: External ear normal.     Left Ear: External ear normal.     Nose: Nose normal. No congestion or rhinorrhea.     Mouth/Throat:     Mouth: Mucous membranes are moist.  Eyes:     General:        Right eye: No discharge.        Left eye: No discharge.     Extraocular Movements: Extraocular movements intact.     Conjunctiva/sclera: Conjunctivae normal.     Pupils: Pupils are equal, round, and reactive to light.  Cardiovascular:     Rate and Rhythm: Normal rate and regular rhythm.     Pulses:          Radial pulses are 2+ on the right side and 2+ on the left side.       Posterior tibial pulses are 2+ on the right side and 2+ on the left side.     Heart sounds: Normal heart sounds, S1 normal and S2 normal. No murmur heard. Pulmonary:     Effort: Pulmonary effort is  normal. No respiratory distress.     Breath sounds: Normal breath sounds. No wheezing, rhonchi or rales.  Abdominal:     General: Abdomen is flat. Bowel sounds are normal.  Musculoskeletal:     Cervical back: Normal range of motion and neck supple.     Right lower leg: No edema.     Left lower leg: No edema.  Skin:    General: Skin is warm and dry.     Capillary Refill: Capillary refill takes less than 2 seconds.  Neurological:     General: No focal deficit present.     Mental Status: He is alert and oriented to person, place, and time.  Psychiatric:        Mood and Affect: Mood normal.        Behavior: Behavior normal.        Thought Content: Thought content normal.        Judgment: Judgment normal.     Results for orders placed or performed in visit on 09/19/22  TSH  Result Value Ref Range   TSH 4.810 (H) 0.450 - 4.500 uIU/mL  Lipid panel  Result  Value Ref Range   Cholesterol, Total 159 100 - 199 mg/dL   Triglycerides 161 (H) 0 - 149 mg/dL   HDL 34 (L) >09 mg/dL   VLDL Cholesterol Cal 37 5 - 40 mg/dL   LDL Chol Calc (NIH) 88 0 - 99 mg/dL   Chol/HDL Ratio 4.7 0.0 - 5.0 ratio  CBC with Differential/Platelet  Result Value Ref Range   WBC 8.9 3.4 - 10.8 x10E3/uL   RBC 5.37 4.14 - 5.80 x10E6/uL   Hemoglobin 15.7 13.0 - 17.7 g/dL   Hematocrit 60.4 54.0 - 51.0 %   MCV 86 79 - 97 fL   MCH 29.2 26.6 - 33.0 pg   MCHC 33.9 31.5 - 35.7 g/dL   RDW 98.1 19.1 - 47.8 %   Platelets 243 150 - 450 x10E3/uL   Neutrophils 65 Not Estab. %   Lymphs 26 Not Estab. %   Monocytes 7 Not Estab. %   Eos 2 Not Estab. %   Basos 0 Not Estab. %   Neutrophils Absolute 5.8 1.4 - 7.0 x10E3/uL   Lymphocytes Absolute 2.3 0.7 - 3.1 x10E3/uL   Monocytes Absolute 0.6 0.1 - 0.9 x10E3/uL   EOS (ABSOLUTE) 0.2 0.0 - 0.4 x10E3/uL   Basophils Absolute 0.0 0.0 - 0.2 x10E3/uL   Immature Granulocytes 0 Not Estab. %   Immature Grans (Abs) 0.0 0.0 - 0.1 x10E3/uL  Comprehensive metabolic panel  Result Value Ref Range   Glucose 105 (H) 70 - 99 mg/dL   BUN 7 6 - 24 mg/dL   Creatinine, Ser 2.95 (L) 0.76 - 1.27 mg/dL   eGFR 621 >30 QM/VHQ/4.69   BUN/Creatinine Ratio 10 9 - 20   Sodium 141 134 - 144 mmol/L   Potassium 4.5 3.5 - 5.2 mmol/L   Chloride 104 96 - 106 mmol/L   CO2 23 20 - 29 mmol/L   Calcium 9.2 8.7 - 10.2 mg/dL   Total Protein 7.0 6.0 - 8.5 g/dL   Albumin 4.5 4.1 - 5.1 g/dL   Globulin, Total 2.5 1.5 - 4.5 g/dL   Albumin/Globulin Ratio 1.8 1.2 - 2.2   Bilirubin Total 0.3 0.0 - 1.2 mg/dL   Alkaline Phosphatase 68 44 - 121 IU/L   AST 30 0 - 40 IU/L   ALT 64 (H) 0 - 44 IU/L  Urinalysis, Routine w reflex microscopic  Result Value Ref Range   Specific Gravity, UA 1.023 1.005 - 1.030   pH, UA 5.0 5.0 - 7.5   Color, UA Yellow Yellow   Appearance Ur Clear Clear   Leukocytes,UA Negative Negative   Protein,UA Negative Negative/Trace   Glucose, UA Negative  Negative   Ketones, UA Negative Negative   RBC, UA Negative Negative   Bilirubin, UA Negative Negative   Urobilinogen, Ur 0.2 0.2 - 1.0 mg/dL   Nitrite, UA Negative Negative   Microscopic Examination Comment   HIV Antibody (routine testing w rflx)  Result Value Ref Range   HIV Screen 4th Generation wRfx Non Reactive Non Reactive  Hepatitis C Antibody  Result Value Ref Range   Hep C Virus Ab Non Reactive Non Reactive  Specimen status report  Result Value Ref Range   specimen status report Comment       Assessment & Plan:   Problem List Items Addressed This Visit     Hypertension - Primary    Acute, controlled. CMP today. Continue Lisinopril 20mg  daily. Provided information on DASH diet and exercising 150 mins weekly.       Relevant Orders   Comp Met (CMET)     Follow up plan: Return in about 2 months (around 03/22/2023) for BP Recheck.

## 2023-01-20 NOTE — Assessment & Plan Note (Signed)
Acute, controlled. CMP today. Continue Lisinopril 20mg  daily. Provided information on DASH diet and exercising 150 mins weekly.

## 2023-01-20 NOTE — Patient Instructions (Addendum)
150 minutes of physical activity weekly Try the DASH diet, limit sodium intake Start checking BP at home and write it down

## 2023-01-21 LAB — COMPREHENSIVE METABOLIC PANEL
ALT: 85 IU/L — ABNORMAL HIGH (ref 0–44)
AST: 37 IU/L (ref 0–40)
Albumin: 4.6 g/dL (ref 4.1–5.1)
Alkaline Phosphatase: 81 IU/L (ref 44–121)
BUN/Creatinine Ratio: 11 (ref 9–20)
BUN: 10 mg/dL (ref 6–24)
Bilirubin Total: 0.2 mg/dL (ref 0.0–1.2)
CO2: 21 mmol/L (ref 20–29)
Calcium: 9.7 mg/dL (ref 8.7–10.2)
Chloride: 103 mmol/L (ref 96–106)
Creatinine, Ser: 0.89 mg/dL (ref 0.76–1.27)
Globulin, Total: 2.7 g/dL (ref 1.5–4.5)
Glucose: 102 mg/dL — ABNORMAL HIGH (ref 70–99)
Potassium: 4.4 mmol/L (ref 3.5–5.2)
Sodium: 142 mmol/L (ref 134–144)
Total Protein: 7.3 g/dL (ref 6.0–8.5)
eGFR: 110 mL/min/{1.73_m2} (ref >59)

## 2023-01-23 NOTE — Progress Notes (Signed)
Hi Miguel Hull your electrolyte and kidney function results came back normal, however your ALT which measures your liver function came back elevated. We will recheck this at your next visit in August.

## 2023-02-21 ENCOUNTER — Other Ambulatory Visit: Payer: Self-pay | Admitting: Nurse Practitioner

## 2023-02-22 MED ORDER — LISINOPRIL 20 MG PO TABS: 20.0000 mg | ORAL_TABLET | Freq: Every day | ORAL | 0 refills | Status: DC

## 2023-03-27 ENCOUNTER — Encounter: Payer: Self-pay | Admitting: Family Medicine

## 2023-03-27 ENCOUNTER — Ambulatory Visit: Payer: BC Managed Care – PPO | Admitting: Family Medicine

## 2023-03-27 VITALS — BP 120/77 | HR 76 | Ht 69.0 in | Wt 258.2 lb

## 2023-03-27 DIAGNOSIS — I10 Essential (primary) hypertension: Secondary | ICD-10-CM | POA: Diagnosis not present

## 2023-03-27 LAB — MICROALBUMIN, URINE WAIVED
Creatinine, Urine Waived: 300 mg/dL (ref 10–300)
Microalb, Ur Waived: 30 mg/L — ABNORMAL HIGH (ref 0–19)
Microalb/Creat Ratio: <30 mg/g (ref <30)

## 2023-03-27 MED ORDER — VALSARTAN 40 MG PO TABS: 40.0000 mg | ORAL_TABLET | Freq: Every day | ORAL | 3 refills | Status: DC

## 2023-03-27 NOTE — Assessment & Plan Note (Signed)
Chronic, controlled. Urine microalbumin today. Switched Lisinopril 20 mg to Valsartan 40 mg daily. Recommend continue with DASH diet and exercising 150 mins weekly. Return in 1 month for follow up.

## 2023-03-27 NOTE — Progress Notes (Signed)
BP 120/77   Pulse 76   Ht 5\' 9"  (1.753 m)   Wt 258 lb 3.2 oz (117.1 kg)   SpO2 96%   BMI 38.13 kg/m    Subjective:    Patient ID: Miguel Hull, male    DOB: Oct 24, 1980, 42 y.o.   MRN: [id number]  HPI: Miguel Hull is a 42 y.o. male  Chief Complaint  Patient presents with   Hypertension   HYPERTENSION without Chronic Kidney Disease He is taking Lisinopril 20 mg daily and has made lifestyle changes to his diet, eating more baked foods and less fried foods with increased water intake. He is complaining of a dry cough today that started 3 weeks ago. He admits to tobacco use and denies alcohol use. Hypertension status: controlled  Satisfied with current treatment? yes Duration of hypertension: chronic BP monitoring frequency:  not checking BP range: 120s/80s BP medication side effects:  yes Medication compliance: excellent compliance Aspirin: no Recurrent headaches: no Visual changes: no Palpitations: no Dyspnea: No Chest pain: no Lower extremity edema: no Dizzy/lightheaded: no   Relevant past medical, surgical, family and social history reviewed and updated as indicated. Interim medical history since our last visit reviewed. Allergies and medications reviewed and updated.  Review of Systems  Eyes:  Negative for visual disturbance.  Respiratory: Negative.    Cardiovascular: Negative.   Neurological:  Negative for dizziness, syncope, light-headedness and headaches.    Per HPI unless specifically indicated above     Objective:    BP 120/77   Pulse 76   Ht 5\' 9"  (1.753 m)   Wt 258 lb 3.2 oz (117.1 kg)   SpO2 96%   BMI 38.13 kg/m   Wt Readings from Last 3 Encounters:  03/27/23 258 lb 3.2 oz (117.1 kg)  01/20/23 260 lb 9.6 oz (118.2 kg)  12/20/22 261 lb 6.4 oz (118.6 kg)    Physical Exam Vitals and nursing note reviewed.  Constitutional:      General: He is awake. He is not in acute distress.    Appearance: Normal appearance. He is  well-developed and well-groomed. He is obese. He is not ill-appearing.  HENT:     Head: Normocephalic and atraumatic.     Right Ear: Hearing and external ear normal. No drainage.     Left Ear: Hearing and external ear normal. No drainage.     Nose: Nose normal.  Eyes:     General: Lids are normal.        Right eye: No discharge.        Left eye: No discharge.     Conjunctiva/sclera: Conjunctivae normal.  Cardiovascular:     Rate and Rhythm: Normal rate and regular rhythm.     Pulses:          Radial pulses are 2+ on the right side and 2+ on the left side.       Posterior tibial pulses are 2+ on the right side and 2+ on the left side.     Heart sounds: Normal heart sounds, S1 normal and S2 normal. No murmur heard.    No gallop.  Pulmonary:     Effort: Pulmonary effort is normal. No accessory muscle usage or respiratory distress.     Breath sounds: Normal breath sounds.  Musculoskeletal:        General: Normal range of motion.     Cervical back: Full passive range of motion without pain and normal range of motion.  Right lower leg: No edema.     Left lower leg: No edema.  Skin:    General: Skin is warm and dry.     Capillary Refill: Capillary refill takes less than 2 seconds.  Neurological:     Mental Status: He is alert and oriented to person, place, and time.  Psychiatric:        Attention and Perception: Attention normal.        Mood and Affect: Mood normal.        Speech: Speech normal.        Behavior: Behavior normal. Behavior is cooperative.        Thought Content: Thought content normal.     Results for orders placed or performed in visit on 01/20/23  Comp Met (CMET)  Result Value Ref Range   Glucose 102 (H) 70 - 99 mg/dL   BUN 10 6 - 24 mg/dL   Creatinine, Ser 1.32 0.76 - 1.27 mg/dL   eGFR 440 >10 UV/OZD/6.64   BUN/Creatinine Ratio 11 9 - 20   Sodium 142 134 - 144 mmol/L   Potassium 4.4 3.5 - 5.2 mmol/L   Chloride 103 96 - 106 mmol/L   CO2 21 20 - 29  mmol/L   Calcium 9.7 8.7 - 10.2 mg/dL   Total Protein 7.3 6.0 - 8.5 g/dL   Albumin 4.6 4.1 - 5.1 g/dL   Globulin, Total 2.7 1.5 - 4.5 g/dL   Bilirubin Total 0.2 0.0 - 1.2 mg/dL   Alkaline Phosphatase 81 44 - 121 IU/L   AST 37 0 - 40 IU/L   ALT 85 (H) 0 - 44 IU/L      Assessment & Plan:   Problem List Items Addressed This Visit     Hypertension - Primary    Chronic, controlled. Urine microalbumin today. Switched Lisinopril 20 mg to Valsartan 40 mg daily. Recommend continue with DASH diet and exercising 150 mins weekly. Return in 1 month for follow up.       Relevant Medications   valsartan (DIOVAN) 40 MG tablet   Other Relevant Orders   Urine Microalbumin w/creat. ratio   Microalbumin, Urine Waived     Follow up plan: Return in about 1 month (around 04/27/2023) for BP Recheck.

## 2023-05-01 ENCOUNTER — Ambulatory Visit: Payer: BC Managed Care – PPO | Admitting: Family Medicine

## 2023-05-04 ENCOUNTER — Ambulatory Visit: Payer: BC Managed Care – PPO | Admitting: Family Medicine

## 2023-05-04 VITALS — BP 132/86 | HR 72 | Temp 97.7°F | Ht 69.69 in | Wt 254.0 lb

## 2023-05-04 DIAGNOSIS — R748 Abnormal levels of other serum enzymes: Secondary | ICD-10-CM | POA: Diagnosis not present

## 2023-05-04 DIAGNOSIS — I10 Essential (primary) hypertension: Secondary | ICD-10-CM | POA: Diagnosis not present

## 2023-05-04 NOTE — Progress Notes (Addendum)
BP 132/86   Pulse 72   Temp 97.7 F (36.5 C) (Oral)   Ht 5' 9.69" (1.77 m)   Wt 254 lb (115.2 kg)   SpO2 94%   BMI 36.78 kg/m    Subjective:    Patient ID: Miguel Hull, male    DOB: [birth year], 42 y.o.   MRN: [id number]  HPI: Miguel Hull is a 42 y.o. male  Chief Complaint  Patient presents with   Hypertension   HYPERTENSION without Chronic Kidney Disease He is taking Valsartan 40 MG daily for one month since switching Lisinopril d/t cough side effects, cough has resolved. He is continuing lifestyle changes to his diet, eating more baked foods and less fried foods with increased water intake of 3-4 bottles daily. He is averaging at work 3.5 miles daily according to his apple watch, not very active at home, planning on gym membership. He admits to tobacco use, cigarette 1/2 pack -3/4 pack daily and has recently decreased from 2 packs daily and denies alcohol use. Caffeine intake consist of 2 cups of coffee daily, and 1/2 can of coffee- energy drink daily Hypertension status: controlled  Satisfied with current treatment? yes Duration of hypertension: chronic BP monitoring frequency:  not checking but able to start and will start checking BP range: Not checking BP medication side effects:  No Medication compliance: excellent compliance Aspirin: no Recurrent headaches: no Visual changes: no Palpitations: no Dyspnea: No Chest pain: no Lower extremity edema: no Dizzy/lightheaded: no   He is dealing with some situational stress today, but admits he has this taken care of and mood is okay. PHQ 9 today 8, GAD 7 today 6. Admits his score is related to some situational stress. Denies need for counseling/therapy.     05/04/2023    9:44 AM 03/27/2023    8:30 AM 01/20/2023   10:18 AM 12/20/2022    9:46 AM 09/19/2022   10:13 AM  Depression screen PHQ 2/9  Decreased Interest 1 1 1 1 1   Down, Depressed, Hopeless 1 0 1 1 1   PHQ - 2 Score 2 1 2 2 2   Altered sleeping 2  1 1 1 1   Tired, decreased energy 1 1 1 1 1   Change in appetite 1 1 1 2 1   Feeling bad or failure about yourself  0 0 0 0 0  Trouble concentrating 1 1 1 1 1   Moving slowly or fidgety/restless 1 0 0 0 0  Suicidal thoughts 0 0 0 0 0  PHQ-9 Score 8 5 6 7 6   Difficult doing work/chores Somewhat difficult Not difficult at all Not difficult at all Somewhat difficult Somewhat difficult        05/04/2023    9:45 AM 03/27/2023    8:30 AM 01/20/2023   10:19 AM 12/20/2022    9:46 AM  GAD 7 : Generalized Anxiety Score  Nervous, Anxious, on Edge 1 1 1 1   Control/stop worrying 1 0 1 1  Worry too much - different things 1 1 0 1  Trouble relaxing 1 1 1 1   Restless 1 1 1 1   Easily annoyed or irritable 1 0 0 1  Afraid - awful might happen 0 0 0 0  Total GAD 7 Score 6 4 4 6   Anxiety Difficulty Somewhat difficult  Not difficult at all Somewhat difficult    Relevant past medical, surgical, family and social history reviewed and updated as indicated. Interim medical history since our last visit reviewed. Allergies and medications  reviewed and updated.  Review of Systems  Eyes:  Negative for visual disturbance.  Respiratory: Negative.    Cardiovascular: Negative.   Neurological:  Negative for dizziness, syncope, light-headedness and headaches.    Per HPI unless specifically indicated above     Objective:    BP 132/86   Pulse 72   Temp 97.7 F (36.5 C) (Oral)   Ht 5' 9.69" (1.77 m)   Wt 254 lb (115.2 kg)   SpO2 94%   BMI 36.78 kg/m   Wt Readings from Last 3 Encounters:  05/04/23 254 lb (115.2 kg)  03/27/23 258 lb 3.2 oz (117.1 kg)  01/20/23 260 lb 9.6 oz (118.2 kg)    Physical Exam Vitals and nursing note reviewed.  Constitutional:      General: He is awake. He is not in acute distress.    Appearance: Normal appearance. He is well-developed and well-groomed. He is obese. He is not ill-appearing.  HENT:     Head: Normocephalic and atraumatic.     Right Ear: Hearing and external ear  normal. No drainage.     Left Ear: Hearing and external ear normal. No drainage.     Nose: Nose normal.  Eyes:     General: Lids are normal.        Right eye: No discharge.        Left eye: No discharge.     Conjunctiva/sclera: Conjunctivae normal.  Cardiovascular:     Rate and Rhythm: Normal rate and regular rhythm.     Pulses:          Radial pulses are 2+ on the right side and 2+ on the left side.       Posterior tibial pulses are 2+ on the right side and 2+ on the left side.     Heart sounds: Normal heart sounds, S1 normal and S2 normal. No murmur heard.    No gallop.  Pulmonary:     Effort: Pulmonary effort is normal. No accessory muscle usage or respiratory distress.     Breath sounds: Normal breath sounds.  Musculoskeletal:        General: Normal range of motion.     Cervical back: Full passive range of motion without pain and normal range of motion.     Right lower leg: No edema.     Left lower leg: No edema.  Skin:    General: Skin is warm and dry.     Capillary Refill: Capillary refill takes less than 2 seconds.  Neurological:     Mental Status: He is alert and oriented to person, place, and time.  Psychiatric:        Attention and Perception: Attention normal.        Mood and Affect: Mood normal.        Speech: Speech normal.        Behavior: Behavior normal. Behavior is cooperative.        Thought Content: Thought content normal.     Results for orders placed or performed in visit on 05/04/23  Comp Met (CMET)  Result Value Ref Range   Glucose 86 70 - 99 mg/dL   BUN 13 6 - 24 mg/dL   Creatinine, Ser 4.09 0.76 - 1.27 mg/dL   eGFR 811 >91 YN/WGN/5.62   BUN/Creatinine Ratio 15 9 - 20   Sodium 140 134 - 144 mmol/L   Potassium 4.2 3.5 - 5.2 mmol/L   Chloride 102 96 - 106 mmol/L   CO2 24  20 - 29 mmol/L   Calcium 9.8 8.7 - 10.2 mg/dL   Total Protein 7.3 6.0 - 8.5 g/dL   Albumin 4.3 4.1 - 5.1 g/dL   Globulin, Total 3.0 1.5 - 4.5 g/dL   Bilirubin Total 0.3 0.0 -  1.2 mg/dL   Alkaline Phosphatase 89 44 - 121 IU/L   AST 30 0 - 40 IU/L   ALT 85 (H) 0 - 44 IU/L      Assessment & Plan:   Problem List Items Addressed This Visit     Hypertension - Primary    Chronic, controlled. CMP today. Doing well on Valsartan 40 MG since switching from Lisinopril d/t cough SE. BP today 132/86. Recommend continue with DASH diet and exercising 150 mins weekly, start checking BP at home and bring in recordings at next visit. Return in 4 month for follow up and physical. Call sooner if concerns arise.       Relevant Orders   Comp Met (CMET) (Completed)     Follow up plan: Return in about 4 months (around 09/04/2023) for BP Recheck, Follow up, physical.

## 2023-05-04 NOTE — Patient Instructions (Addendum)
Continue plan for gym membership and exercising 150 minutes weekly or 30 mins for 5 days  Continue limiting sodium intake  Continue with water increases 125 ounces daily

## 2023-05-04 NOTE — Assessment & Plan Note (Addendum)
Chronic, controlled. CMP today. Doing well on Valsartan 40 MG since switching from Lisinopril d/t cough SE. BP today 132/86. Recommend continue with DASH diet and exercising 150 mins weekly, start checking BP at home and bring in recordings at next visit. Return in 4 month for follow up and physical. Call sooner if concerns arise.

## 2023-05-05 LAB — COMPREHENSIVE METABOLIC PANEL
ALT: 85 [IU]/L — ABNORMAL HIGH (ref 0–44)
AST: 30 [IU]/L (ref 0–40)
Albumin: 4.3 g/dL (ref 4.1–5.1)
Alkaline Phosphatase: 89 [IU]/L (ref 44–121)
BUN/Creatinine Ratio: 15 (ref 9–20)
BUN: 13 mg/dL (ref 6–24)
Bilirubin Total: 0.3 mg/dL (ref 0.0–1.2)
CO2: 24 mmol/L (ref 20–29)
Calcium: 9.8 mg/dL (ref 8.7–10.2)
Chloride: 102 mmol/L (ref 96–106)
Creatinine, Ser: 0.89 mg/dL (ref 0.76–1.27)
Globulin, Total: 3 g/dL (ref 1.5–4.5)
Glucose: 86 mg/dL (ref 70–99)
Potassium: 4.2 mmol/L (ref 3.5–5.2)
Sodium: 140 mmol/L (ref 134–144)
Total Protein: 7.3 g/dL (ref 6.0–8.5)
eGFR: 110 mL/min/{1.73_m2} (ref >59)

## 2023-05-08 ENCOUNTER — Telehealth: Payer: Self-pay

## 2023-05-08 NOTE — Addendum Note (Signed)
Addended by: Prescott Gum on: 05/08/2023 12:06 PM   Modules accepted: Orders

## 2023-05-08 NOTE — Telephone Encounter (Signed)
Pt was supposed to have 2 week lab visit, not 2 month follow up.  Will call back to schedule 2 week labs.

## 2023-05-08 NOTE — Telephone Encounter (Signed)
Pt called back stating someone had called about scheduling lab work. Noted in OV that pt needed to come back in 2 months for Hepatic fx panel. Appt made.

## 2023-05-08 NOTE — Progress Notes (Signed)
Hi Miguel Hull, your electrolytes, kidney function, and liver function have returned as normal. However your ALT remains slightly elevated. I recommend we recheck these levels in 2 months. We will get you scheduled for a lab visit for this. I recommend to avoid any Tylenol or alcohol use if any, these items can cause your liver enzymes to increase. Thank you for allowing me to participate in your care.

## 2023-05-19 ENCOUNTER — Other Ambulatory Visit: Payer: BC Managed Care – PPO

## 2023-05-19 DIAGNOSIS — R748 Abnormal levels of other serum enzymes: Secondary | ICD-10-CM

## 2023-05-20 LAB — HEPATIC FUNCTION PANEL
ALT: 75 [IU]/L — ABNORMAL HIGH (ref 0–44)
AST: 35 [IU]/L (ref 0–40)
Albumin: 4.3 g/dL (ref 4.1–5.1)
Alkaline Phosphatase: 85 [IU]/L (ref 44–121)
Bilirubin Total: 0.2 mg/dL (ref 0.0–1.2)
Bilirubin, Direct: <0.1 mg/dL (ref 0.00–0.40)
Total Protein: 6.9 g/dL (ref 6.0–8.5)

## 2023-05-26 ENCOUNTER — Encounter: Payer: Self-pay | Admitting: Family Medicine

## 2023-05-26 ENCOUNTER — Other Ambulatory Visit: Payer: Self-pay | Admitting: Family Medicine

## 2023-05-26 NOTE — Progress Notes (Signed)
Hi Lowry your ALT level remains slightly elevated. We would like to recheck these levels in one month to ensure they remain stable. We will have someone reach out to schedule a lab visit in one month. In the meantime avoid the use of Tylenol. Thank you for allowing me to participate in your care.

## 2023-07-07 ENCOUNTER — Other Ambulatory Visit: Payer: BC Managed Care – PPO

## 2023-07-10 ENCOUNTER — Other Ambulatory Visit: Payer: BC Managed Care – PPO

## 2023-07-10 DIAGNOSIS — R748 Abnormal levels of other serum enzymes: Secondary | ICD-10-CM

## 2023-07-12 LAB — COMPREHENSIVE METABOLIC PANEL
ALT: 82 IU/L — ABNORMAL HIGH (ref 0–44)
AST: 45 [IU]/L — ABNORMAL HIGH (ref 0–40)
Albumin: 4.5 g/dL (ref 4.1–5.1)
Alkaline Phosphatase: 78 [IU]/L (ref 44–121)
BUN/Creatinine Ratio: 10 (ref 9–20)
BUN: 10 mg/dL (ref 6–24)
Bilirubin Total: 0.3 mg/dL (ref 0.0–1.2)
CO2: 22 mmol/L (ref 20–29)
Calcium: 9.1 mg/dL (ref 8.7–10.2)
Chloride: 99 mmol/L (ref 96–106)
Creatinine, Ser: 1.03 mg/dL (ref 0.76–1.27)
Globulin, Total: 2.9 g/dL (ref 1.5–4.5)
Glucose: 113 mg/dL — ABNORMAL HIGH (ref 70–99)
Potassium: 3.9 mmol/L (ref 3.5–5.2)
Sodium: 141 mmol/L (ref 134–144)
Total Protein: 7.4 g/dL (ref 6.0–8.5)
eGFR: 93 mL/min/{1.73_m2} (ref >59)

## 2023-07-28 ENCOUNTER — Ambulatory Visit
Admission: RE | Admit: 2023-07-28 | Discharge: 2023-07-28 | Disposition: A | Discharge status: Home / Self Care | Payer: BC Managed Care – PPO | Source: Ambulatory Visit | Attending: Nurse Practitioner | Admitting: Nurse Practitioner

## 2023-07-28 DIAGNOSIS — R7989 Other specified abnormal findings of blood chemistry: Secondary | ICD-10-CM | POA: Diagnosis not present

## 2023-07-28 DIAGNOSIS — R748 Abnormal levels of other serum enzymes: Secondary | ICD-10-CM | POA: Diagnosis not present

## 2023-07-28 DIAGNOSIS — Z9049 Acquired absence of other specified parts of digestive tract: Secondary | ICD-10-CM | POA: Diagnosis not present

## 2023-10-02 ENCOUNTER — Ambulatory Visit: Payer: Self-pay | Admitting: Nurse Practitioner

## 2023-10-09 ENCOUNTER — Ambulatory Visit: Payer: BC Managed Care – PPO | Admitting: Nurse Practitioner

## 2023-10-09 VITALS — BP 124/84 | HR 103 | Ht 69.69 in | Wt 258.2 lb

## 2023-10-09 DIAGNOSIS — I10 Essential (primary) hypertension: Secondary | ICD-10-CM

## 2023-10-09 NOTE — Progress Notes (Signed)
 BP 124/84 (BP Location: Left Arm, Patient Position: Sitting, Cuff Size: Large)   Pulse (!) 103   Ht 5' 9.69" (1.77 m)   Wt 258 lb 3.2 oz (117.1 kg)   SpO2 98%   BMI 37.38 kg/m    Subjective:    Patient ID: Miguel Hull, male    DOB: 1981/02/24, 43 y.o.   MRN: [id number]  HPI: Miguel Hull is a 43 y.o. male  Chief Complaint  Patient presents with   Blood Pressure Check   HYPERTENSION without Chronic Kidney Disease He is taking Valsartan 40 MG daily for about 3 months.  He is continuing lifestyle changes to his diet, eating more baked foods and less fried foods with increased water intake of 4-5 bottles daily. He is averaging at work 3.5 miles daily according to his apple watch, does not want to get a gym membership but has purchased a treadmill that he plans to use. He admits to tobacco use, cigarette 1/2 pack -3/4 pack daily and has recently decreased from 2 packs daily and denies alcohol use. Mainly smokes at work.  Caffeine intake consist of 2 cups of coffee daily- no longer drinking energy drinks. Hypertension status: controlled  Satisfied with current treatment? yes Duration of hypertension: chronic BP monitoring frequency:  couple times per week BP range: 120/80s BP medication side effects:  No Medication compliance: excellent compliance Aspirin: no Recurrent headaches: no Visual changes: no Palpitations: no Dyspnea: No Chest pain: no Lower extremity edema: no Dizzy/lightheaded: no   Relevant past medical, surgical, family and social history reviewed and updated as indicated. Interim medical history since our last visit reviewed. Allergies and medications reviewed and updated.  Review of Systems  Eyes:  Negative for visual disturbance.  Respiratory: Negative.    Cardiovascular: Negative.   Neurological:  Negative for dizziness, syncope, light-headedness and headaches.    Per HPI unless specifically indicated above     Objective:    BP  124/84 (BP Location: Left Arm, Patient Position: Sitting, Cuff Size: Large)   Pulse (!) 103   Ht 5' 9.69" (1.77 m)   Wt 258 lb 3.2 oz (117.1 kg)   SpO2 98%   BMI 37.38 kg/m   Wt Readings from Last 3 Encounters:  10/09/23 258 lb 3.2 oz (117.1 kg)  05/04/23 254 lb (115.2 kg)  03/27/23 258 lb 3.2 oz (117.1 kg)    Physical Exam Vitals and nursing note reviewed.  Constitutional:      General: He is awake. He is not in acute distress.    Appearance: Normal appearance. He is well-developed and well-groomed. He is obese. He is not ill-appearing.  HENT:     Head: Normocephalic and atraumatic.     Right Ear: Hearing and external ear normal. No drainage.     Left Ear: Hearing and external ear normal. No drainage.     Nose: Nose normal.  Eyes:     General: Lids are normal.        Right eye: No discharge.        Left eye: No discharge.     Conjunctiva/sclera: Conjunctivae normal.  Cardiovascular:     Rate and Rhythm: Normal rate and regular rhythm.     Pulses:          Radial pulses are 2+ on the right side and 2+ on the left side.       Posterior tibial pulses are 2+ on the right side and 2+ on the left side.  Heart sounds: Normal heart sounds, S1 normal and S2 normal. No murmur heard.    No gallop.  Pulmonary:     Effort: Pulmonary effort is normal. No accessory muscle usage or respiratory distress.     Breath sounds: Normal breath sounds.  Musculoskeletal:        General: Normal range of motion.     Cervical back: Full passive range of motion without pain and normal range of motion.     Right lower leg: No edema.     Left lower leg: No edema.  Skin:    General: Skin is warm and dry.     Capillary Refill: Capillary refill takes less than 2 seconds.  Neurological:     Mental Status: He is alert and oriented to person, place, and time.  Psychiatric:        Attention and Perception: Attention normal.        Mood and Affect: Mood normal.        Speech: Speech normal.         Behavior: Behavior normal. Behavior is cooperative.        Thought Content: Thought content normal.     Results for orders placed or performed in visit on 07/10/23  Comp Met (CMET)   Collection Time: 07/10/23  4:01 PM  Result Value Ref Range   Glucose 113 (H) 70 - 99 mg/dL   BUN 10 6 - 24 mg/dL   Creatinine, Ser 4.09 0.76 - 1.27 mg/dL   eGFR 93 >81 XB/JYN/8.29   BUN/Creatinine Ratio 10 9 - 20   Sodium 141 134 - 144 mmol/L   Potassium 3.9 3.5 - 5.2 mmol/L   Chloride 99 96 - 106 mmol/L   CO2 22 20 - 29 mmol/L   Calcium 9.1 8.7 - 10.2 mg/dL   Total Protein 7.4 6.0 - 8.5 g/dL   Albumin 4.5 4.1 - 5.1 g/dL   Globulin, Total 2.9 1.5 - 4.5 g/dL   Bilirubin Total 0.3 0.0 - 1.2 mg/dL   Alkaline Phosphatase 78 44 - 121 IU/L   AST 45 (H) 0 - 40 IU/L   ALT 82 (H) 0 - 44 IU/L      Assessment & Plan:   Problem List Items Addressed This Visit       Cardiovascular and Mediastinum   Hypertension - Primary   Chronic.  Controlled.  Continue with current medication regimen of Valsartan 40mg  daily.  Refills sent today.  Praised patient for lifestyle changes. Encouraged him to continue with lifestyle changes.  Labs ordered today.  Return to clinic in 6 months for reevaluation.  Call sooner if concerns arise.        Relevant Orders   Comp Met (CMET)     Follow up plan: Return in about 6 months (around 04/10/2024) for Physical and Fasting labs.

## 2023-10-09 NOTE — Assessment & Plan Note (Signed)
 Chronic.  Controlled.  Continue with current medication regimen of Valsartan 40mg  daily.  Refills sent today.  Praised patient for lifestyle changes. Encouraged him to continue with lifestyle changes.  Labs ordered today.  Return to clinic in 6 months for reevaluation.  Call sooner if concerns arise.

## 2023-10-10 ENCOUNTER — Encounter: Payer: Self-pay | Admitting: Nurse Practitioner

## 2023-10-10 LAB — COMPREHENSIVE METABOLIC PANEL
ALT: 55 IU/L — ABNORMAL HIGH (ref 0–44)
AST: 31 IU/L (ref 0–40)
Albumin: 4.3 g/dL (ref 4.1–5.1)
Alkaline Phosphatase: 76 IU/L (ref 44–121)
BUN/Creatinine Ratio: 7 — ABNORMAL LOW (ref 9–20)
BUN: 6 mg/dL (ref 6–24)
Bilirubin Total: 0.3 mg/dL (ref 0.0–1.2)
CO2: 24 mmol/L (ref 20–29)
Calcium: 9.3 mg/dL (ref 8.7–10.2)
Chloride: 103 mmol/L (ref 96–106)
Creatinine, Ser: 0.88 mg/dL (ref 0.76–1.27)
Globulin, Total: 2.8 g/dL (ref 1.5–4.5)
Glucose: 156 mg/dL — ABNORMAL HIGH (ref 70–99)
Potassium: 3.9 mmol/L (ref 3.5–5.2)
Sodium: 142 mmol/L (ref 134–144)
Total Protein: 7.1 g/dL (ref 6.0–8.5)
eGFR: 110 mL/min/{1.73_m2} (ref >59)

## 2024-04-02 ENCOUNTER — Other Ambulatory Visit: Payer: Self-pay | Admitting: Nurse Practitioner

## 2024-04-02 NOTE — Telephone Encounter (Unsigned)
 Copied from CRM [id number]. Topic: Clinical - Medication Refill >> Apr 02, 2024 12:27 PM Mia F wrote: Medication: valsartan  (DIOVAN ) 40 MG tablet   Has the patient contacted their pharmacy? Yes (Agent: If no, request that the patient contact the pharmacy for the refill. If patient does not wish to contact the pharmacy document the reason why and proceed with request.) (Agent: If yes, when and what did the pharmacy advise?)  This is the patient's preferred pharmacy:  [facility] [address], [place] - [address] [place] [address] Phone: [phone] Fax: [phone]  Is this the correct pharmacy for this prescription? Yes If no, delete pharmacy and type the correct one.   Has the prescription been filled recently? Yes  Is the patient out of the medication? Yes  Has the patient been seen for an appointment in the last year OR does the patient have an upcoming appointment? Yes  Can we respond through MyChart? Yes  Agent: Please be advised that Rx refills may take up to 3 business days. We ask that you follow-up with your pharmacy.

## 2024-04-03 MED ORDER — VALSARTAN 40 MG PO TABS: 40.0000 mg | ORAL_TABLET | Freq: Every day | ORAL | 0 refills | Status: DC

## 2024-04-03 NOTE — Telephone Encounter (Signed)
 Requested Prescriptions  Pending Prescriptions Disp Refills   valsartan  (DIOVAN ) 40 MG tablet 90 tablet 0    Sig: Take 1 tablet (40 mg total) by mouth daily.     Cardiovascular:  Angiotensin Receptor Blockers Passed - 04/03/2024  9:24 AM      Passed - Cr in normal range and within 180 days    Creatinine  Date Value Ref Range Status  07/09/2013 1.00 0.60 - 1.30 mg/dL Final   Creatinine, Ser  Date Value Ref Range Status  10/09/2023 0.88 0.76 - 1.27 mg/dL Final         Passed - K in normal range and within 180 days    Potassium  Date Value Ref Range Status  10/09/2023 3.9 3.5 - 5.2 mmol/L Final  07/09/2013 3.7 3.5 - 5.1 mmol/L Final         Passed - Patient is not pregnant      Passed - Last BP in normal range    BP Readings from Last 1 Encounters:  10/09/23 124/84         Passed - Valid encounter within last 6 months    Recent Outpatient Visits           5 months ago Primary hypertension   [place] [facility] Melvin Pao, NP

## 2024-04-10 ENCOUNTER — Encounter: Payer: Self-pay | Admitting: Nurse Practitioner

## 2024-04-10 ENCOUNTER — Ambulatory Visit (INDEPENDENT_AMBULATORY_CARE_PROVIDER_SITE_OTHER): Admitting: Nurse Practitioner

## 2024-04-10 VITALS — BP 132/78 | HR 79 | Temp 97.7°F | Ht 69.7 in | Wt 257.0 lb

## 2024-04-10 DIAGNOSIS — E669 Obesity, unspecified: Secondary | ICD-10-CM

## 2024-04-10 DIAGNOSIS — Z0000 Encounter for general adult medical examination without abnormal findings: Secondary | ICD-10-CM | POA: Diagnosis not present

## 2024-04-10 DIAGNOSIS — E781 Pure hyperglyceridemia: Secondary | ICD-10-CM

## 2024-04-10 DIAGNOSIS — I10 Essential (primary) hypertension: Secondary | ICD-10-CM | POA: Diagnosis not present

## 2024-04-10 DIAGNOSIS — R7309 Other abnormal glucose: Secondary | ICD-10-CM | POA: Diagnosis not present

## 2024-04-10 MED ORDER — VALSARTAN 40 MG PO TABS: 40.0000 mg | ORAL_TABLET | Freq: Every day | ORAL | 1 refills | Status: AC

## 2024-04-10 NOTE — Assessment & Plan Note (Signed)
 Chronic.  Controlled.  Continue with current medication regimen of valsartan  40mg  daily.  Labs ordered today.  Return to clinic in 6 months for reevaluation.  Call sooner if concerns arise.

## 2024-04-10 NOTE — Assessment & Plan Note (Signed)
 Recommended eating smaller high protein, low fat meals more frequently and exercising 30 mins a day 5 times a week with a goal of 10-15lb weight loss in the next 3 months.

## 2024-04-10 NOTE — Progress Notes (Signed)
 BP 132/78   Pulse 79   Temp 97.7 F (36.5 C) (Oral)   Ht 5' 9.7 (1.77 m)   Wt 257 lb (116.6 kg)   SpO2 98%   BMI 37.19 kg/m    Subjective:    Patient ID: Miguel Hull, male    DOB: 1981-03-04, 43 y.o.   MRN: [id number]  HPI: Miguel Hull is a 43 y.o. male presenting on 04/10/2024 for comprehensive medical examination. Current medical complaints include:none  He currently lives with: Interim Problems from his last visit: no  HYPERTENSION without Chronic Kidney Disease Hypertension status: controlled  Satisfied with current treatment? yes Duration of hypertension: years BP monitoring frequency:  not checking BP range:  BP medication side effects:  no Medication compliance: excellent compliance Previous BP meds:valsartan  Aspirin: no Recurrent headaches: no Visual changes: no Palpitations: no Dyspnea: no Chest pain: no Lower extremity edema: no Dizzy/lightheaded: no.   Depression Screen done today and results listed below:     04/10/2024    3:40 PM 10/09/2023    1:20 PM 05/04/2023    9:44 AM 03/27/2023    8:30 AM 01/20/2023   10:18 AM  Depression screen PHQ 2/9  Decreased Interest 0 1 1 1 1   Down, Depressed, Hopeless 0 0 1 0 1  PHQ - 2 Score 0 1 2 1 2   Altered sleeping 1 1 2 1 1   Tired, decreased energy 1 1 1 1 1   Change in appetite 0 0 1 1 1   Feeling bad or failure about yourself  0 0 0 0 0  Trouble concentrating 0 0 1 1 1   Moving slowly or fidgety/restless 0 0 1 0 0  Suicidal thoughts 0 0 0 0 0  PHQ-9 Score 2 3 8 5 6   Difficult doing work/chores Not difficult at all  Somewhat difficult Not difficult at all Not difficult at all    The patient does not have a history of falls. I did complete a risk assessment for falls. A plan of care for falls was documented.   Past Medical History:  Past Medical History:  Diagnosis Date   Asthma     Surgical History:  Past Surgical History:  Procedure Laterality Date   CHOLECYSTECTOMY  2011     Medications:  No current outpatient medications on file prior to visit.   No current facility-administered medications on file prior to visit.    Allergies:  No Known Allergies  Social History:  Social History   Socioeconomic History   Marital status: Single    Spouse name: Not on file   Number of children: Not on file   Years of education: Not on file   Highest education level: Some college, no degree  Occupational History   Not on file  Tobacco Use   Smoking status: Every Day    Current packs/day: 0.50    Types: Cigarettes   Smokeless tobacco: Never  Vaping Use   Vaping status: Never Used  Substance and Sexual Activity   Alcohol use: Not Currently   Drug use: Never   Sexual activity: Not Currently  Other Topics Concern   Not on file  Social History Narrative   Not on file   Social Drivers of Health   Financial Resource Strain: Low Risk  (04/10/2024)   Overall Financial Resource Strain (CARDIA)    Difficulty of Paying Living Expenses: Not hard at all  Food Insecurity: No Food Insecurity (04/10/2024)   Hunger Vital Sign    Worried  About Running Out of Food in the Last Year: Never true    Ran Out of Food in the Last Year: Never true  Transportation Needs: No Transportation Needs (04/10/2024)   PRAPARE - Administrator, Civil Service (Medical): No    Lack of Transportation (Non-Medical): No  Physical Activity: Insufficiently Active (04/10/2024)   Exercise Vital Sign    Days of Exercise per Week: 3 days    Minutes of Exercise per Session: 20 min  Stress: No Stress Concern Present (04/10/2024)   [employer] of Occupational Health - Occupational Stress Questionnaire    Feeling of Stress: Only a little  Social Connections: Socially Isolated (04/10/2024)   Social Connection and Isolation Panel    Frequency of Communication with Friends and Family: More than three times a week    Frequency of Social Gatherings with Friends and Family: Twice a week     Attends Religious Services: Never    Database administrator or Organizations: No    Attends Banker Meetings: Never    Marital Status: Never married  Intimate Partner Violence: Not At Risk (04/10/2024)   Humiliation, Afraid, Rape, and Kick questionnaire    Fear of Current or Ex-Partner: No    Emotionally Abused: No    Physically Abused: No    Sexually Abused: No   Social History   Tobacco Use  Smoking Status Every Day   Current packs/day: 0.50   Types: Cigarettes  Smokeless Tobacco Never   Social History   Substance and Sexual Activity  Alcohol Use Not Currently    Family History:  Family History  Problem Relation Age of Onset   Congestive Heart Failure Mother    Cancer Father    Depression Sister    COPD Sister    Heart murmur Sister    Melanoma Brother     Past medical history, surgical history, medications, allergies, family history and social history reviewed with patient today and changes made to appropriate areas of the chart.   Review of Systems  Eyes:  Negative for blurred vision and double vision.  Respiratory:  Negative for shortness of breath.   Cardiovascular:  Negative for chest pain, palpitations and leg swelling.  Neurological:  Negative for dizziness and headaches.   All other ROS negative except what is listed above and in the HPI.      Objective:    BP 132/78   Pulse 79   Temp 97.7 F (36.5 C) (Oral)   Ht 5' 9.7 (1.77 m)   Wt 257 lb (116.6 kg)   SpO2 98%   BMI 37.19 kg/m   Wt Readings from Last 3 Encounters:  04/10/24 257 lb (116.6 kg)  10/09/23 258 lb 3.2 oz (117.1 kg)  05/04/23 254 lb (115.2 kg)    Physical Exam Vitals and nursing note reviewed.  Constitutional:      General: He is not in acute distress.    Appearance: Normal appearance. He is obese. He is not ill-appearing, toxic-appearing or diaphoretic.  HENT:     Head: Normocephalic.     Right Ear: Tympanic membrane, ear canal and external ear normal.      Left Ear: Tympanic membrane, ear canal and external ear normal.     Nose: Nose normal. No congestion or rhinorrhea.     Mouth/Throat:     Mouth: Mucous membranes are moist.  Eyes:     General:        Right eye: No discharge.  Left eye: No discharge.     Extraocular Movements: Extraocular movements intact.     Conjunctiva/sclera: Conjunctivae normal.     Pupils: Pupils are equal, round, and reactive to light.  Cardiovascular:     Rate and Rhythm: Normal rate and regular rhythm.     Heart sounds: No murmur heard. Pulmonary:     Effort: Pulmonary effort is normal. No respiratory distress.     Breath sounds: Normal breath sounds. No wheezing, rhonchi or rales.  Abdominal:     General: Abdomen is flat. Bowel sounds are normal. There is no distension.     Palpations: Abdomen is soft.     Tenderness: There is no abdominal tenderness. There is no guarding.  Musculoskeletal:     Cervical back: Normal range of motion and neck supple.  Skin:    General: Skin is warm and dry.     Capillary Refill: Capillary refill takes less than 2 seconds.  Neurological:     General: No focal deficit present.     Mental Status: He is alert and oriented to person, place, and time.     Cranial Nerves: No cranial nerve deficit.     Motor: No weakness.     Deep Tendon Reflexes: Reflexes normal.  Psychiatric:        Mood and Affect: Mood normal.        Behavior: Behavior normal.        Thought Content: Thought content normal.        Judgment: Judgment normal.     Results for orders placed or performed in visit on 10/09/23  Comp Met (CMET)   Collection Time: 10/09/23  1:29 PM  Result Value Ref Range   Glucose 156 (H) 70 - 99 mg/dL   BUN 6 6 - 24 mg/dL   Creatinine, Ser 9.11 0.76 - 1.27 mg/dL   eGFR 889 >40 fO/fpw/8.26   BUN/Creatinine Ratio 7 (L) 9 - 20   Sodium 142 134 - 144 mmol/L   Potassium 3.9 3.5 - 5.2 mmol/L   Chloride 103 96 - 106 mmol/L   CO2 24 20 - 29 mmol/L   Calcium 9.3 8.7 -  10.2 mg/dL   Total Protein 7.1 6.0 - 8.5 g/dL   Albumin 4.3 4.1 - 5.1 g/dL   Globulin, Total 2.8 1.5 - 4.5 g/dL   Bilirubin Total 0.3 0.0 - 1.2 mg/dL   Alkaline Phosphatase 76 44 - 121 IU/L   AST 31 0 - 40 IU/L   ALT 55 (H) 0 - 44 IU/L      Assessment & Plan:   Problem List Items Addressed This Visit       Cardiovascular and Mediastinum   Hypertension   Chronic.  Controlled.  Continue with current medication regimen of valsartan  40mg  daily.  Labs ordered today.  Return to clinic in 6 months for reevaluation.  Call sooner if concerns arise.        Relevant Medications   valsartan  (DIOVAN ) 40 MG tablet     Other   Obesity (BMI 30-39.9)   Recommended eating smaller high protein, low fat meals more frequently and exercising 30 mins a day 5 times a week with a goal of 10-15lb weight loss in the next 3 months.       Hypertriglyceridemia   Labs ordered at visit today.  Will make recommendations based on lab results.        Relevant Medications   valsartan  (DIOVAN ) 40 MG tablet   Other Visit Diagnoses  Annual physical exam    -  Primary   Health maintenance reviewed during visit today.  Labs ordered.  Vaccines reviewed.   Relevant Orders   TSH   Lipid panel   CBC with Differential/Platelet   Comprehensive metabolic panel with GFR     Elevated glucose       Labs ordered at visit today. Will make recommendations based on results.   Relevant Orders   HgB A1c        Discussed aspirin prophylaxis for myocardial infarction prevention and decision was it was not indicated  LABORATORY TESTING:  Health maintenance labs ordered today as discussed above.     IMMUNIZATIONS:   - Tdap: Tetanus vaccination status reviewed: last tetanus booster within 10 years. - Influenza: Refused - Pneumovax: Not applicable - Prevnar: Not applicable - COVID: Refused - HPV: Not applicable - Shingrix vaccine: Not applicable  SCREENING: - Colonoscopy: Not applicable  Discussed with  patient purpose of the colonoscopy is to detect colon cancer at curable precancerous or early stages   - AAA Screening: Not applicable  -Hearing Test: Not applicable  -Spirometry: Not applicable   PATIENT COUNSELING:    Sexuality: Discussed sexually transmitted diseases, partner selection, use of condoms, avoidance of unintended pregnancy  and contraceptive alternatives.   Advised to avoid cigarette smoking.  I discussed with the patient that most people either abstain from alcohol or drink within safe limits (<=14/week and <=4 drinks/occasion for males, <=7/weeks and <= 3 drinks/occasion for females) and that the risk for alcohol disorders and other health effects rises proportionally with the number of drinks per week and how often a drinker exceeds daily limits.  Discussed cessation/primary prevention of drug use and availability of treatment for abuse.   Diet: Encouraged to adjust caloric intake to maintain  or achieve ideal body weight, to reduce intake of dietary saturated fat and total fat, to limit sodium intake by avoiding high sodium foods and not adding table salt, and to maintain adequate dietary potassium and calcium preferably from fresh fruits, vegetables, and low-fat dairy products.    stressed the importance of regular exercise  Injury prevention: Discussed safety belts, safety helmets, smoke detector, smoking near bedding or upholstery.   Dental health: Discussed importance of regular tooth brushing, flossing, and dental visits.   Follow up plan: NEXT PREVENTATIVE PHYSICAL DUE IN 1 YEAR. Return in about 6 months (around 10/08/2024) for HTN, HLD, DM2 FU.

## 2024-04-10 NOTE — Assessment & Plan Note (Signed)
 Labs ordered at visit today.  Will make recommendations based on lab results.

## 2024-04-11 ENCOUNTER — Ambulatory Visit: Payer: Self-pay | Admitting: Nurse Practitioner

## 2024-04-11 LAB — COMPREHENSIVE METABOLIC PANEL WITH GFR
ALT: 86 IU/L — ABNORMAL HIGH (ref 0–44)
AST: 38 IU/L (ref 0–40)
Albumin: 4.5 g/dL (ref 4.1–5.1)
Alkaline Phosphatase: 96 IU/L (ref 44–121)
BUN/Creatinine Ratio: 9 (ref 9–20)
BUN: 8 mg/dL (ref 6–24)
Bilirubin Total: 0.3 mg/dL (ref 0.0–1.2)
CO2: 24 mmol/L (ref 20–29)
Calcium: 9.6 mg/dL (ref 8.7–10.2)
Chloride: 102 mmol/L (ref 96–106)
Creatinine, Ser: 0.92 mg/dL (ref 0.76–1.27)
Globulin, Total: 2.7 g/dL (ref 1.5–4.5)
Glucose: 99 mg/dL (ref 70–99)
Potassium: 4.1 mmol/L (ref 3.5–5.2)
Sodium: 141 mmol/L (ref 134–144)
Total Protein: 7.2 g/dL (ref 6.0–8.5)
eGFR: 107 mL/min/1.73 (ref >59)

## 2024-04-11 LAB — HEMOGLOBIN A1C
Est. average glucose Bld gHb Est-mCnc: 174 mg/dL
Hgb A1c MFr Bld: 7.7 % — ABNORMAL HIGH (ref 4.8–5.6)

## 2024-04-11 LAB — CBC WITH DIFFERENTIAL/PLATELET
Basophils Absolute: 0 x10E3/uL (ref 0.0–0.2)
Basos: 0 %
EOS (ABSOLUTE): 0.1 x10E3/uL (ref 0.0–0.4)
Eos: 2 %
Hematocrit: 44.1 % (ref 37.5–51.0)
Hemoglobin: 14.4 g/dL (ref 13.0–17.7)
Immature Grans (Abs): 0 x10E3/uL (ref 0.0–0.1)
Immature Granulocytes: 0 %
Lymphocytes Absolute: 2.8 x10E3/uL (ref 0.7–3.1)
Lymphs: 30 %
MCH: 28.2 pg (ref 26.6–33.0)
MCHC: 32.7 g/dL (ref 31.5–35.7)
MCV: 86 fL (ref 79–97)
Monocytes Absolute: 0.6 x10E3/uL (ref 0.1–0.9)
Monocytes: 7 %
Neutrophils Absolute: 5.8 x10E3/uL (ref 1.4–7.0)
Neutrophils: 61 %
Platelets: 241 x10E3/uL (ref 150–450)
RBC: 5.11 x10E6/uL (ref 4.14–5.80)
RDW: 13.6 % (ref 11.6–15.4)
WBC: 9.4 x10E3/uL (ref 3.4–10.8)

## 2024-04-11 LAB — LIPID PANEL
Chol/HDL Ratio: 5.6 ratio — ABNORMAL HIGH (ref 0.0–5.0)
Cholesterol, Total: 167 mg/dL (ref 100–199)
HDL: 30 mg/dL — ABNORMAL LOW (ref >39)
LDL Chol Calc (NIH): 89 mg/dL (ref 0–99)
Triglycerides: 287 mg/dL — ABNORMAL HIGH (ref 0–149)
VLDL Cholesterol Cal: 48 mg/dL — ABNORMAL HIGH (ref 5–40)

## 2024-04-11 LAB — TSH: TSH: 4.41 u[IU]/mL (ref 0.450–4.500)

## 2024-04-22 ENCOUNTER — Encounter: Admitting: Nurse Practitioner

## 2024-10-08 ENCOUNTER — Ambulatory Visit: Admitting: Nurse Practitioner
